# Patient Record
Sex: Male | Born: 1960 | Race: White | Hispanic: No | Marital: Married | Smoking: Never smoker
Health system: Southern US, Community
[De-identification: ages and names within clinical notes are randomized; demographics above are authoritative.]

## PROBLEM LIST (undated history)

## (undated) DIAGNOSIS — F411 Generalized anxiety disorder: Secondary | ICD-10-CM

## (undated) DIAGNOSIS — E739 Lactose intolerance, unspecified: Secondary | ICD-10-CM

## (undated) DIAGNOSIS — I2510 Atherosclerotic heart disease of native coronary artery without angina pectoris: Secondary | ICD-10-CM

## (undated) DIAGNOSIS — F3289 Other specified depressive episodes: Secondary | ICD-10-CM

## (undated) DIAGNOSIS — L259 Unspecified contact dermatitis, unspecified cause: Secondary | ICD-10-CM

## (undated) DIAGNOSIS — K219 Gastro-esophageal reflux disease without esophagitis: Secondary | ICD-10-CM

## (undated) DIAGNOSIS — E785 Hyperlipidemia, unspecified: Secondary | ICD-10-CM

## (undated) DIAGNOSIS — I2119 ST elevation (STEMI) myocardial infarction involving other coronary artery of inferior wall: Secondary | ICD-10-CM

## (undated) DIAGNOSIS — T7840XA Allergy, unspecified, initial encounter: Secondary | ICD-10-CM

## (undated) DIAGNOSIS — F329 Major depressive disorder, single episode, unspecified: Secondary | ICD-10-CM

## (undated) DIAGNOSIS — I495 Sick sinus syndrome: Secondary | ICD-10-CM

## (undated) DIAGNOSIS — L408 Other psoriasis: Secondary | ICD-10-CM

## (undated) HISTORY — PX: CORONARY STENT PLACEMENT: SHX1402

## (undated) HISTORY — DX: Unspecified contact dermatitis, unspecified cause: L25.9

## (undated) HISTORY — DX: Atherosclerotic heart disease of native coronary artery without angina pectoris: I25.10

## (undated) HISTORY — DX: Major depressive disorder, single episode, unspecified: F32.9

## (undated) HISTORY — DX: Lactose intolerance, unspecified: E73.9

## (undated) HISTORY — DX: ST elevation (STEMI) myocardial infarction involving other coronary artery of inferior wall: I21.19

## (undated) HISTORY — DX: Other psoriasis: L40.8

## (undated) HISTORY — DX: Hyperlipidemia, unspecified: E78.5

## (undated) HISTORY — DX: Sick sinus syndrome: I49.5

## (undated) HISTORY — DX: Other specified depressive episodes: F32.89

## (undated) HISTORY — DX: Allergy, unspecified, initial encounter: T78.40XA

## (undated) HISTORY — DX: Generalized anxiety disorder: F41.1

## (undated) HISTORY — DX: Gastro-esophageal reflux disease without esophagitis: K21.9

---

## 1997-04-21 ENCOUNTER — Encounter: Payer: Self-pay | Admitting: Family Medicine

## 1997-04-21 LAB — CONVERTED CEMR LAB: Blood Glucose, Fasting: 82 mg/dL

## 1999-05-29 ENCOUNTER — Encounter: Payer: Self-pay | Admitting: Family Medicine

## 1999-05-29 LAB — CONVERTED CEMR LAB: Blood Glucose, Fasting: 93 mg/dL

## 1999-08-12 ENCOUNTER — Encounter (INDEPENDENT_AMBULATORY_CARE_PROVIDER_SITE_OTHER): Payer: Self-pay | Admitting: *Deleted

## 1999-08-12 ENCOUNTER — Ambulatory Visit (HOSPITAL_BASED_OUTPATIENT_CLINIC_OR_DEPARTMENT_OTHER): Admission: RE | Admit: 1999-08-12 | Discharge: 1999-08-12 | Payer: Self-pay | Admitting: Surgery

## 2004-03-27 ENCOUNTER — Ambulatory Visit: Payer: Self-pay | Admitting: Family Medicine

## 2004-09-09 ENCOUNTER — Ambulatory Visit: Payer: Self-pay | Admitting: Family Medicine

## 2004-09-09 LAB — CONVERTED CEMR LAB
Blood Glucose, Fasting: 100 mg/dL
TSH: 0.79 microintl units/mL

## 2004-10-14 ENCOUNTER — Ambulatory Visit: Payer: Self-pay | Admitting: Family Medicine

## 2004-11-26 ENCOUNTER — Ambulatory Visit: Payer: Self-pay | Admitting: Family Medicine

## 2005-01-17 ENCOUNTER — Ambulatory Visit: Payer: Self-pay | Admitting: Family Medicine

## 2005-01-20 ENCOUNTER — Ambulatory Visit: Payer: Self-pay | Admitting: Family Medicine

## 2005-07-17 ENCOUNTER — Ambulatory Visit: Payer: Self-pay | Admitting: Family Medicine

## 2005-07-17 LAB — CONVERTED CEMR LAB: TSH: 1.02 microintl units/mL

## 2005-07-21 ENCOUNTER — Ambulatory Visit: Payer: Self-pay | Admitting: Family Medicine

## 2006-10-08 ENCOUNTER — Telehealth (INDEPENDENT_AMBULATORY_CARE_PROVIDER_SITE_OTHER): Payer: Self-pay | Admitting: *Deleted

## 2006-11-12 ENCOUNTER — Ambulatory Visit: Payer: Self-pay | Admitting: Family Medicine

## 2006-11-12 ENCOUNTER — Telehealth: Payer: Self-pay | Admitting: Family Medicine

## 2006-11-12 DIAGNOSIS — F329 Major depressive disorder, single episode, unspecified: Secondary | ICD-10-CM

## 2006-11-12 DIAGNOSIS — F411 Generalized anxiety disorder: Secondary | ICD-10-CM | POA: Insufficient documentation

## 2006-11-12 DIAGNOSIS — L259 Unspecified contact dermatitis, unspecified cause: Secondary | ICD-10-CM

## 2006-11-12 DIAGNOSIS — K219 Gastro-esophageal reflux disease without esophagitis: Secondary | ICD-10-CM

## 2006-11-12 DIAGNOSIS — E785 Hyperlipidemia, unspecified: Secondary | ICD-10-CM | POA: Insufficient documentation

## 2006-11-12 DIAGNOSIS — L408 Other psoriasis: Secondary | ICD-10-CM

## 2007-10-25 ENCOUNTER — Ambulatory Visit: Payer: Self-pay | Admitting: Family Medicine

## 2009-03-27 ENCOUNTER — Observation Stay (HOSPITAL_COMMUNITY): Admission: EM | Admit: 2009-03-27 | Discharge: 2009-03-28 | Payer: Self-pay | Admitting: Emergency Medicine

## 2009-03-27 ENCOUNTER — Encounter: Payer: Self-pay | Admitting: Emergency Medicine

## 2009-03-28 HISTORY — PX: AMPUTATION FINGER / THUMB: SUR24

## 2009-06-28 ENCOUNTER — Ambulatory Visit: Payer: Self-pay | Admitting: Family Medicine

## 2009-06-28 LAB — CONVERTED CEMR LAB
BUN: 17 mg/dL (ref 6–23)
Basophils Absolute: 0 10*3/uL (ref 0.0–0.1)
Bilirubin, Direct: 0.1 mg/dL (ref 0.0–0.3)
Cholesterol: 252 mg/dL — ABNORMAL HIGH (ref 0–200)
Creatinine, Ser: 0.9 mg/dL (ref 0.4–1.5)
GFR calc non Af Amer: 94.87 mL/min (ref >60)
Glucose, Bld: 107 mg/dL — ABNORMAL HIGH (ref 70–99)
HCT: 45.9 % (ref 39.0–52.0)
Lymphs Abs: 3.8 10*3/uL (ref 0.7–4.0)
Monocytes Absolute: 0.5 10*3/uL (ref 0.1–1.0)
Monocytes Relative: 6.5 % (ref 3.0–12.0)
Neutrophils Relative %: 42.8 % — ABNORMAL LOW (ref 43.0–77.0)
PSA: 0.79 ng/mL (ref 0.10–4.00)
Platelets: 237 10*3/uL (ref 150.0–400.0)
Potassium: 4.4 meq/L (ref 3.5–5.1)
RDW: 13.4 % (ref 11.5–14.6)
Total Bilirubin: 0.8 mg/dL (ref 0.3–1.2)
Total CHOL/HDL Ratio: 6
Triglycerides: 140 mg/dL (ref 0.0–149.0)
VLDL: 28 mg/dL (ref 0.0–40.0)

## 2009-07-03 ENCOUNTER — Ambulatory Visit: Payer: Self-pay | Admitting: Family Medicine

## 2009-07-25 ENCOUNTER — Encounter (INDEPENDENT_AMBULATORY_CARE_PROVIDER_SITE_OTHER): Payer: Self-pay | Admitting: *Deleted

## 2009-07-25 ENCOUNTER — Ambulatory Visit: Payer: Self-pay | Admitting: Family Medicine

## 2009-07-25 LAB — FECAL OCCULT BLOOD, GUAIAC: Fecal Occult Blood: NEGATIVE

## 2009-07-25 LAB — CONVERTED CEMR LAB: OCCULT 3: NEGATIVE

## 2009-08-01 HISTORY — PX: OTHER SURGICAL HISTORY: SHX169

## 2009-10-09 ENCOUNTER — Encounter (INDEPENDENT_AMBULATORY_CARE_PROVIDER_SITE_OTHER): Payer: Self-pay | Admitting: *Deleted

## 2010-01-31 DIAGNOSIS — I2510 Atherosclerotic heart disease of native coronary artery without angina pectoris: Secondary | ICD-10-CM

## 2010-01-31 DIAGNOSIS — E739 Lactose intolerance, unspecified: Secondary | ICD-10-CM

## 2010-01-31 HISTORY — DX: Atherosclerotic heart disease of native coronary artery without angina pectoris: I25.10

## 2010-01-31 HISTORY — DX: Lactose intolerance, unspecified: E73.9

## 2010-02-26 ENCOUNTER — Encounter: Payer: Self-pay | Admitting: Internal Medicine

## 2010-02-26 ENCOUNTER — Inpatient Hospital Stay (HOSPITAL_COMMUNITY)
Admission: EM | Admit: 2010-02-26 | Discharge: 2010-02-28 | Payer: Self-pay | Source: Home / Self Care | Attending: Cardiology | Admitting: Cardiology

## 2010-02-26 DIAGNOSIS — I2119 ST elevation (STEMI) myocardial infarction involving other coronary artery of inferior wall: Secondary | ICD-10-CM

## 2010-02-26 HISTORY — DX: ST elevation (STEMI) myocardial infarction involving other coronary artery of inferior wall: I21.19

## 2010-02-26 LAB — CONVERTED CEMR LAB: Hgb A1c MFr Bld: 6 %

## 2010-03-12 ENCOUNTER — Ambulatory Visit
Admission: RE | Admit: 2010-03-12 | Discharge: 2010-03-12 | Payer: Self-pay | Source: Home / Self Care | Attending: Physician Assistant | Admitting: Physician Assistant

## 2010-03-12 ENCOUNTER — Encounter: Payer: Self-pay | Admitting: Physician Assistant

## 2010-03-12 DIAGNOSIS — I2510 Atherosclerotic heart disease of native coronary artery without angina pectoris: Secondary | ICD-10-CM | POA: Insufficient documentation

## 2010-03-12 DIAGNOSIS — I2119 ST elevation (STEMI) myocardial infarction involving other coronary artery of inferior wall: Secondary | ICD-10-CM | POA: Insufficient documentation

## 2010-03-12 DIAGNOSIS — R7303 Prediabetes: Secondary | ICD-10-CM | POA: Insufficient documentation

## 2010-03-12 DIAGNOSIS — I495 Sick sinus syndrome: Secondary | ICD-10-CM | POA: Insufficient documentation

## 2010-03-15 ENCOUNTER — Telehealth: Payer: Self-pay | Admitting: Cardiology

## 2010-03-27 ENCOUNTER — Telehealth: Payer: Self-pay | Admitting: Cardiology

## 2010-04-04 NOTE — Progress Notes (Signed)
Summary: questions-  Phone Note Call from Patient Call back at [phone]   Caller: Patient Summary of Call: Pt feeling lightheaded with B/P 110/70 and heart rate mid high 40 and have question about when it would be okay for him to start exercising Initial call taken by: Judie Grieve,  March 15, 2010 10:57 AM  Follow-up for Phone Call        LVMTCB Whitney Maeola Sarah RN  March 15, 2010 11:13 AM  HR mid-high 40s. Patient feeling a little lightheaded. Wondering if we could decrease Metoprolol. BP 115/70. Advised him I would speak with MD but this should be okay. Whitney Maeola Sarah RN  March 15, 2010 12:36 PM  Follow-up by: Whitney Maeola Sarah RN,  March 15, 2010 11:13 AM  Additional Follow-up for Phone Call Additional follow up Details #1::        Change metoprolol to 25 mg 1/2 tab two times a day. Call if no better on Monday . . . on call provider over weekend if worse.  patient is aware to make med. change Ellender Hose RN  March 15, 2010 2:13 PM  Additional Follow-up by: Tereso Newcomer PA-C,  March 15, 2010 1:32 PM    New/Updated Medications: METOPROLOL TARTRATE 25 MG TABS (METOPROLOL TARTRATE) Take one half  tablet by mouth twice a day

## 2010-04-04 NOTE — Letter (Signed)
Summary: Nadara Eaton letter  [place] at [facility]  [address] [place], [place] [address]   Phone: [phone]  Fax: [phone]       10/09/2009 MRN: [id number]  Jahlani HAIL [address] [place], [place]  [address]  Dear Mr. [facility] [place] and [facility] announce the retirement of Arta Silence, M.D., from full-time practice at the [facility] office effective August 30, 2009 and his plans of returning part-time.  It is important to Dr. Hetty Ely and to our practice that you understand that [facility] has seven physicians in our office for your health care needs.  We will continue to offer the same exceptional care that you have today.    Dr. Hetty Ely has spoken to many of you about his plans for retirement and returning part-time in the fall.   We will continue to work with you through the transition to schedule appointments for you in the office and meet the high standards that [place] is committed to.   Again, it is with great pleasure that we share the news that Dr. Hetty Ely will return to [facility] in October of 2011 with a reduced schedule.    If you have any questions, or would like to request an appointment with one of our physicians, please call us at [phone] and press the option for Scheduling an appointment.  We take pleasure in providing you with excellent patient care and look forward to seeing you at your next office visit.  Our [facility] Physicians are:  Tillman Abide, M.D. Laurita Quint, M.D. Roxy Manns, M.D. Kerby Nora, M.D. Hannah Beat, M.D. Ruthe Mannan, M.D. We proudly welcomed Raechel Ache, M.D. and Eustaquio Boyden, M.D. to the practice in July/August 2011.  Sincerely,  [facility]

## 2010-04-04 NOTE — Letter (Signed)
Summary: Results Follow up Letter  [place] at [facility]  [address] [place], [place] [address]   Phone: [phone]  Fax: [phone]    07/25/2009 MRN: [id number]  Moxon HOLSTON [address] [place], [place]  [address]  Dear Mr. OUZTS,  The following are the results of your recent test(s):  Test         Result    Pap Smear:        Normal _____  Not Normal _____ Comments: ______________________________________________________ Cholesterol: LDL(Bad cholesterol):         Your goal is less than:         HDL (Good cholesterol):       Your goal is more than: Comments:  ______________________________________________________ Mammogram:        Normal _____  Not Normal _____ Comments:  ___________________________________________________________________ Hemoccult:        Normal __X___  Not normal _______ Comments:  Please repeat in one year.  _____________________________________________________________________ Other Tests:    We routinely do not discuss normal results over the telephone.  If you desire a copy of the results, or you have any questions about this information we can discuss them at your next office visit.   Sincerely,    Laurita Quint, MD
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
## 2010-04-04 NOTE — Assessment & Plan Note (Addendum)
Summary: Post Hosp s/p MI   Primary Provider:  Shaune Leeks MD  CC:  Patient is still a little lethargic.Marland Kitchen  History of Present Illness: Primary Cardiologist:  Dr. Shawnie Pons  Jonathan Mcintosh is a 50 yo male with HLP and GERD who was recently admitted to [facility] on 02/26/2010 with CP.  He ruled in for a NSTEMI.  He had recurrent pain and EKG demonstrated Inf STEMI.  He was taken emergently to the cath lab.  He had high grade CAD in the RCA treated with a DES x 2.  He also had moderate residual disease in the LAD.  EF is preserved at 55%.  He returns for follow up.  Labs during his hospitalization 01/2010:  K 4.1; Creat 0.92; AT 24; ALT 43; A1C 6.0; TSH 0.921; Hgb 15.7  He is doing well.  He denies chest discomfort or shortness of breath.  He denies orthopnea, PND or pedal edema.  He denies syncope.  He has had 2 episodes of lightheadedness.  This was with prolonged standing and being in a large crowd.  Otherwise, he denies any fatigue, lightheadedness or near syncope.  Current Medications (verified): 1)  Prilosec Otc 20 Mg Tbec (Omeprazole Magnesium) .... Take One By Mouth Daily 2)  Aspirin Ec 325 Mg Tbec (Aspirin) .... Take One Tablet By Mouth Daily 3)  Metoprolol Tartrate 25 Mg Tabs (Metoprolol Tartrate) .... Take One Tablet By Mouth Twice A Day 4)  Nitrostat 0.4 Mg Subl (Nitroglycerin) .Marland Kitchen.. 1 Tablet Under Tongue At Onset of Chest Pain; You May Repeat Every 5 Minutes For Up To 3 Doses. 5)  Effient 10 Mg Tabs (Prasugrel Hcl) .... Take One Tablet By Mouth Daily 6)  Crestor 40 Mg Tabs (Rosuvastatin Calcium) .... Take One Tablet By Mouth Daily. 7)  Vitamin D3  Otc .... Take 1 Tablet By Mouth Once A Day  Allergies (verified): No Known Drug Allergies  Past History:  Past Medical History: CAD   a. Acute Inf MI 02/26/10 (initially NSTEMI . . recurrent pain . Marland Kitchen Marland KitchenSTEMI)   b. s/p DES x 2 RCA 02/28/10   c. 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  All other systems reviewed and negative.   Vital Signs:  Patient profile:   50 year old male Height:      71.25 inches Weight:      200.50 pounds BMI:     27.87 Pulse rate:   54 / minute Resp:     14 per minute BP sitting:   122 / 86  (left arm)  Vitals Entered By: Micki Riley CNA (March 12, 2010 9:09 AM) CC: Patient is still a little lethargic.   Physical Exam  General:  Well nourished, well developed, in no acute distress HEENT: normal Neck: no JVD Cardiac:  normal S1, S2; RRR; no murmur Lungs:  clear to auscultation bilaterally, no wheezing, rhonchi or rales Abd: soft, nontender, no hepatomegaly Ext: no edema; RFA site without hematoma or bruit Vascular: no carotid  bruits Skin: warm and dry Neuro:  CNs 2-12 intact, no focal abnormalities noted    EKG  Procedure date:  03/12/2010  Findings:      sinus  bradycardia Heart rate 54 Normal axis Borderline inferior Q waves T-wave inversions in leads 3 and aVF no significant change since 02/28/10  Impression & Recommendations:  Problem # 1:  ACUT MYOCARD INFARCT OTH INF WALL EPIS CARE UNS (ICD-410.40) He is doing well post myocardial infarction.  He will continue on aspirin and Effient.  He seems to be tolerating his medications well.  Of note, he does have bradycardia.  Overall he does not seem to be symptomatic with this.  However, he has had a couple of episodes of lightheadedness.  I have asked him to contact us should he continue to have these symptoms.  At that point, I would decrease his metoprolol to 12.5 mg twice daily.  He is waiting for a return phone call for cardiac rehab.  He has been walking  daily without chest pain or dyspnea.  He can return to work 1/2 time and gradually increase as tolerated.  He was provided a note today.  Problem # 2:  CORONARY ATHEROSCLEROSIS NATIVE CORONARY ARTERY (ICD-414.01) He had moderate residual disease in the LAD.  There is a mention of a possibly following up with a nuclear study to assess the LAD territory.  I will be in touch with Dr. [name] to see if this needs to be done prior to his follow up.      Problem # 3:  GLUCOSE INTOLERANCE (ICD-271.3) Recent A1C was 6.  He should follow up with his PCP.  Problem # 4:  HYPERLIPIDEMIA (ICD-272.4) Schedule FLP and LFTs in 8 weeks. Goal LDL is < 70.  His updated medication list for this problem includes:    Crestor 40 Mg Tabs (Rosuvastatin calcium) .Marland Kitchen... Take one tablet by mouth daily.  Problem # 5:  SINUS BRADYCARDIA (ICD-427.81) Overall, I think he is tolerating this.  If he has fatigue or lightheadedness in the future, he should call us and we will cut his metoprolol in 1/2.  Other Orders: EKG w/ Interpretation (93000)  Patient Instructions: 1)  Your physician recommends that you schedule a follow-up appointment in: 6 weeks with Dr. [name] 2)  Your physician recommends that you return for a FASTING lipid profile and liver function test in 6-8 weeks.  3)  Your physician recommends that you continue on your current medications as directed. Please refer to the Current Medication list given to you today. 4)  Please call us if you continue to feel fatigued.   Appended Document: Post Hosp s/p MI    Clinical Lists Changes  Medications: Rx of METOPROLOL TARTRATE 25 MG TABS (METOPROLOL TARTRATE) Take one tablet by mouth twice a day;  #60 x 6;  Signed;  Entered by: Whitney Maeola Sarah RN;  Authorized by: Ronaldo Miyamoto, MD, [facility];  Method used: Electronically to [facility]. [address], [place], [place]  [address], Ph: [phone], Fax: [phone]    Prescriptions: METOPROLOL  TARTRATE 25 MG TABS (METOPROLOL TARTRATE) Take one tablet by mouth twice a day  #60 x 6   Entered by:   Whitney Maeola Sarah RN   Authorized by:   Ronaldo Miyamoto, MD, [facility]   Signed by:   Ellender Hose RN on 03/12/2010   Method used:   Electronically to        [facility]. [address] (retail)       [address]       [place], [place]  [address]       Ph: [phone]  Fax: [phone]   RxID:   [id number]

## 2010-04-04 NOTE — Letter (Signed)
Summary: Work Writer, Main Office  [address]   [place], [place] [address]   Phone: [phone]  Fax: [phone]    Today's Date: March 12, 2010  Name of Patient: Jonathan Mcintosh  The above named patient had a medical visit today at:  9 am.  Please take this into consideration when reviewing the time away from work.    Special Instructions:  [  ] None  [  ] To be off the remainder of today, returning to the normal work / school schedule tomorrow.  [  ] To be off until the next scheduled appointment on ______________________.  [ x ] Other [name] may return to working 1/2 days this week.  He can gradually increase his hours to full time over the next 2-3 weeks.   Sincerely yours,   Tereso Newcomer PA-C

## 2010-04-08 ENCOUNTER — Ambulatory Visit (HOSPITAL_COMMUNITY): Payer: 59

## 2010-04-10 ENCOUNTER — Encounter: Payer: Self-pay | Admitting: Cardiology

## 2010-04-10 ENCOUNTER — Ambulatory Visit (HOSPITAL_COMMUNITY): Payer: Self-pay

## 2010-04-12 ENCOUNTER — Ambulatory Visit (HOSPITAL_COMMUNITY): Payer: Self-pay

## 2010-04-15 ENCOUNTER — Ambulatory Visit (HOSPITAL_COMMUNITY): Payer: Self-pay

## 2010-04-17 ENCOUNTER — Ambulatory Visit (HOSPITAL_COMMUNITY): Payer: Self-pay

## 2010-04-17 ENCOUNTER — Telehealth (INDEPENDENT_AMBULATORY_CARE_PROVIDER_SITE_OTHER): Payer: Self-pay | Admitting: Radiology

## 2010-04-18 ENCOUNTER — Encounter: Payer: Self-pay | Admitting: Cardiology

## 2010-04-18 ENCOUNTER — Encounter: Payer: Self-pay | Admitting: Family Medicine

## 2010-04-18 ENCOUNTER — Ambulatory Visit (HOSPITAL_COMMUNITY): Payer: 59 | Attending: Cardiology

## 2010-04-18 ENCOUNTER — Other Ambulatory Visit (INDEPENDENT_AMBULATORY_CARE_PROVIDER_SITE_OTHER): Payer: 59

## 2010-04-18 ENCOUNTER — Other Ambulatory Visit: Payer: Self-pay

## 2010-04-18 DIAGNOSIS — R079 Chest pain, unspecified: Secondary | ICD-10-CM | POA: Insufficient documentation

## 2010-04-18 DIAGNOSIS — E785 Hyperlipidemia, unspecified: Secondary | ICD-10-CM

## 2010-04-18 DIAGNOSIS — I2510 Atherosclerotic heart disease of native coronary artery without angina pectoris: Secondary | ICD-10-CM

## 2010-04-18 DIAGNOSIS — R0789 Other chest pain: Secondary | ICD-10-CM

## 2010-04-18 LAB — HEPATIC FUNCTION PANEL
Alkaline Phosphatase: 68 U/L (ref 39–117)
Bilirubin, Direct: 0.1 mg/dL (ref 0.0–0.3)
Total Bilirubin: 0.6 mg/dL (ref 0.3–1.2)

## 2010-04-18 LAB — LIPID PANEL
HDL: 36.4 mg/dL — ABNORMAL LOW (ref >39.00)
LDL Cholesterol: 76 mg/dL (ref 0–99)
Total CHOL/HDL Ratio: 4
VLDL: 16.4 mg/dL (ref 0.0–40.0)

## 2010-04-19 ENCOUNTER — Ambulatory Visit (HOSPITAL_COMMUNITY): Payer: Self-pay

## 2010-04-19 ENCOUNTER — Other Ambulatory Visit: Payer: Self-pay

## 2010-04-22 ENCOUNTER — Ambulatory Visit (HOSPITAL_COMMUNITY): Payer: Self-pay

## 2010-04-24 ENCOUNTER — Encounter: Payer: Self-pay | Admitting: Cardiology

## 2010-04-24 ENCOUNTER — Ambulatory Visit: Payer: Self-pay | Admitting: Physician Assistant

## 2010-04-24 ENCOUNTER — Ambulatory Visit (HOSPITAL_COMMUNITY): Payer: Self-pay

## 2010-04-24 ENCOUNTER — Ambulatory Visit (INDEPENDENT_AMBULATORY_CARE_PROVIDER_SITE_OTHER): Payer: 59 | Admitting: Cardiology

## 2010-04-24 DIAGNOSIS — I2510 Atherosclerotic heart disease of native coronary artery without angina pectoris: Secondary | ICD-10-CM

## 2010-04-24 DIAGNOSIS — E785 Hyperlipidemia, unspecified: Secondary | ICD-10-CM

## 2010-04-24 DIAGNOSIS — I2119 ST elevation (STEMI) myocardial infarction involving other coronary artery of inferior wall: Secondary | ICD-10-CM

## 2010-04-24 NOTE — Progress Notes (Signed)
Summary: Question need for myoview  Phone Note Outgoing Call   Call placed by: Julieta Gutting, RN, BSN,  March 27, 2010 4:09 PM Call placed to: Patient Summary of Call: Per Dr [name] this pt needs an Exercise Stress Myoview per cath note. Per the computer it looks like the pt is scheduled for a Lexiscan myoview on 2/17 at 8:30 but this is actually an error.  The pt is not scheduled for a myoview at this time and is not aware that he may need this test done.  I will speak with Dr [name] and further clarify if myoview needs to be done at this time.  Pt has a pending appt with Scott PA-C on 04/24/10. Initial call taken by: Julieta Gutting, RN, BSN,  March 27, 2010 4:13 PM  Follow-up for Phone Call        He should have a stress myoview to assess his LAD, with followup with me to review because of other disease.   Follow-up by: Ronaldo Miyamoto, MD, [facility],  March 29, 2010 7:14 AM  Additional Follow-up for Phone Call Additional follow up Details #1::        No answer at home number. Julieta Gutting, RN, BSN  April 09, 2010 2:17 PM   [phone]---V was able to reach the pt on his cell phone and informed him that Dr [name] would like him to have a stress myoview prior to appt.  I reviewed instructions with the pt over the phone and will also mail instruction sheet to the pt.  Order placed for test.  Pt scheduled for Myoview on 04/18/10. OV with Scott PA-C on 04/24/10.      Additional Follow-up by: Julieta Gutting, RN, BSN,  April 09, 2010 2:24 PM    Additional Follow-up for Phone Call Additional follow up Details #2::    Pt appt changed to 04/24/10 at 3:15 with Dr [name].  Pt aware during myoview. Julieta Gutting, RN, BSN  April 18, 2010 8:59 AM

## 2010-04-24 NOTE — Assessment & Plan Note (Addendum)
Summary: Cardiology Nuclear Testing  Nuclear Med Background Indications for Stress Test: Evaluation for Ischemia, Stent Patency   History: Heart Catheterization, Myocardial Infarction, Stents  History Comments: 02/26/10 MI initial NSTEMI>STEMI (IWMI); 02/28/10 Cath/stent RCAx2  Symptoms: Fatigue, Light-Headedness, Palpitations  Symptoms Comments: Bradycardia   Nuclear Pre-Procedure Cardiac Risk Factors: Lipids Caffeine/Decaff Intake: None NPO After: 9:00 PM Lungs: clear IV 0.9% NS with Angio Cath: 20g     IV Site: R Antecubital IV Started by: Irean Hong, RN Chest Size (in) 46     Height (in): 71.25 Weight (lb): 193 BMI: 26.83 Tech Comments: Held metoprolol 24 hrs.  Nuclear Med Study 1 or 2 day study:  1 day     Stress Test Type:  Stress Reading MD:  Willa Rough, MD     Referring MD:  T.Stuckey Resting Radionuclide:  Technetium 61m Tetrofosmin     Resting Radionuclide Dose:  11.0 mCi  Stress Radionuclide:  Technetium 58m Tetrofosmin     Stress Radionuclide Dose:  33.0 mCi   Stress Protocol Exercise Time (min):  10:01 min     Max HR:  160 bpm     Predicted Max HR:  170 bpm  Max Systolic BP: 162 mm Hg     Percent Max HR:  94.12 %     METS: 11.7 Rate Pressure Product:  19147    Stress Test Technologist:  Milana Na, EMT-P     Nuclear Technologist:  Doyne Keel, CNMT  Rest Procedure  Myocardial perfusion imaging was performed at rest 45 minutes following the intravenous administration of Technetium 55m Tetrofosmin.  Stress Procedure  The patient exercised for 10:01. The patient stopped due to fatigue and denied any chest pain.  There were no significant ST-T wave changes and occ pvcs.  Technetium 53m Tetrofosmin was injected at peak exercise and myocardial perfusion imaging was performed after a brief delay.  QPS Raw Data Images:  Patient motion noted; appropriate software correction applied. Stress Images:  No diagnostic abnormalities Rest Images:  Same as  stress. Subtraction (SDS):  No evidence of ischemia. Transient Ischemic Dilatation:  1.02  (Normal <1.22)  Lung/Heart Ratio:  0.30  (Normal <0.45)  Quantitative Gated Spect Images QGS EDV:  104 ml QGS ESV:  37 ml QGS EF:  65 % QGS cine images:  Normal  Findings Low risk nuclear study      Overall Impression  Exercise Capacity: Good exercise capacity. BP Response: Normal blood pressure response. Clinical Symptoms: No chest pain ECG Impression: No significant ST segment change suggestive of ischemia. Overall Impression Comments: There is apical thinning. There is interference from visceral activity that affects the images. However, there is no definite scar or ischemia.  Appended Document: Cardiology Nuclear Testing Attempted to call listed number.  No answer times two.  Will try again tomorow.  Films and nuc study reviewed in detail.  TS

## 2010-04-24 NOTE — Progress Notes (Signed)
Summary: Nuclear Pre-Procedure  Phone Note Outgoing Call Call back at [phone]   Call placed by: Stanton Kidney, EMT-P,  April 17, 2010 11:24 AM Call placed to: Patient Action Taken: Phone Call Completed Reason for Call: Confirm/change Appt Summary of Call: Left message with information on [employer] Information Sheet (see scanned document for details). Stanton Kidney, EMT-P  April 17, 2010 11:24 AM      Nuclear Med Background Indications for Stress Test: Evaluation for Ischemia, Stent Patency   History: Heart Catheterization, Myocardial Infarction, Stents  History Comments: 02/26/10 MI initial NSTEMI>STEMI (IWMI); 02/28/10 Cath/stent RCAx2  Symptoms: Fatigue, Light-Headedness  Symptoms Comments: Bradycardia   Nuclear Pre-Procedure Cardiac Risk Factors: Lipids Height (in): 71.25

## 2010-04-26 ENCOUNTER — Ambulatory Visit (HOSPITAL_COMMUNITY): Payer: Self-pay

## 2010-04-29 ENCOUNTER — Ambulatory Visit (HOSPITAL_COMMUNITY): Payer: Self-pay

## 2010-05-01 ENCOUNTER — Ambulatory Visit (HOSPITAL_COMMUNITY): Payer: Self-pay

## 2010-05-02 ENCOUNTER — Ambulatory Visit (INDEPENDENT_AMBULATORY_CARE_PROVIDER_SITE_OTHER): Payer: 59 | Admitting: Internal Medicine

## 2010-05-02 ENCOUNTER — Encounter: Payer: Self-pay | Admitting: Internal Medicine

## 2010-05-02 DIAGNOSIS — E785 Hyperlipidemia, unspecified: Secondary | ICD-10-CM

## 2010-05-02 DIAGNOSIS — E739 Lactose intolerance, unspecified: Secondary | ICD-10-CM

## 2010-05-02 DIAGNOSIS — I2510 Atherosclerotic heart disease of native coronary artery without angina pectoris: Secondary | ICD-10-CM

## 2010-05-03 ENCOUNTER — Ambulatory Visit (HOSPITAL_COMMUNITY): Payer: Self-pay

## 2010-05-06 ENCOUNTER — Ambulatory Visit (HOSPITAL_COMMUNITY): Payer: Self-pay

## 2010-05-08 ENCOUNTER — Ambulatory Visit (HOSPITAL_COMMUNITY): Payer: Self-pay

## 2010-05-09 ENCOUNTER — Other Ambulatory Visit: Payer: 59
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
## 2010-05-10 ENCOUNTER — Ambulatory Visit (HOSPITAL_COMMUNITY): Payer: Self-pay

## 2010-05-13 ENCOUNTER — Ambulatory Visit (HOSPITAL_COMMUNITY): Payer: Self-pay

## 2010-05-13 LAB — CBC
HCT: 45.2 % (ref 39.0–52.0)
Hemoglobin: 15.7 g/dL (ref 13.0–17.0)
MCH: 27.4 pg (ref 26.0–34.0)
MCHC: 34.7 g/dL (ref 30.0–36.0)
MCV: 78.5 fL (ref 78.0–100.0)
MCV: 78.9 fL (ref 78.0–100.0)
Platelets: 208 10*3/uL (ref 150–400)
RDW: 13.1 % (ref 11.5–15.5)
RDW: 13.2 % (ref 11.5–15.5)
WBC: 9 10*3/uL (ref 4.0–10.5)

## 2010-05-13 LAB — LIPID PANEL
Cholesterol: 211 mg/dL — ABNORMAL HIGH (ref 0–200)
LDL Cholesterol: 156 mg/dL — ABNORMAL HIGH (ref 0–99)
Total CHOL/HDL Ratio: 5.7 RATIO
Triglycerides: 92 mg/dL (ref <150)
VLDL: 18 mg/dL (ref 0–40)

## 2010-05-13 LAB — BASIC METABOLIC PANEL
BUN: 23 mg/dL (ref 6–23)
Calcium: 9 mg/dL (ref 8.4–10.5)
Chloride: 107 mEq/L (ref 96–112)
Creatinine, Ser: 0.94 mg/dL (ref 0.4–1.5)
GFR calc non Af Amer: >60 mL/min (ref >60)

## 2010-05-13 LAB — CARDIAC PANEL(CRET KIN+CKTOT+MB+TROPI)
CK, MB: 2.3 ng/mL (ref 0.3–4.0)
CK, MB: 2.5 ng/mL (ref 0.3–4.0)
Total CK: 68 U/L (ref 7–232)
Troponin I: 0.46 ng/mL — ABNORMAL HIGH (ref 0.00–0.06)

## 2010-05-13 LAB — COMPREHENSIVE METABOLIC PANEL
BUN: 15 mg/dL (ref 6–23)
CO2: 23 mEq/L (ref 19–32)
Calcium: 9 mg/dL (ref 8.4–10.5)
Creatinine, Ser: 0.92 mg/dL (ref 0.4–1.5)
GFR calc non Af Amer: >60 mL/min (ref >60)
Glucose, Bld: 106 mg/dL — ABNORMAL HIGH (ref 70–99)
Total Protein: 6.7 g/dL (ref 6.0–8.3)

## 2010-05-13 LAB — CK TOTAL AND CKMB (NOT AT ARMC)
CK, MB: 2.4 ng/mL (ref 0.3–4.0)
Relative Index: INVALID (ref 0.0–2.5)

## 2010-05-13 LAB — TROPONIN I: Troponin I: 0.21 ng/mL — ABNORMAL HIGH (ref 0.00–0.06)

## 2010-05-13 LAB — DIFFERENTIAL
Basophils Absolute: 0 10*3/uL (ref 0.0–0.1)
Basophils Relative: 0 % (ref 0–1)
Eosinophils Absolute: 0.1 10*3/uL (ref 0.0–0.7)
Eosinophils Relative: 2 % (ref 0–5)
Lymphocytes Relative: 45 % (ref 12–46)
Lymphs Abs: 3.5 10*3/uL (ref 0.7–4.0)
Monocytes Relative: 8 % (ref 3–12)
Neutro Abs: 5.6 10*3/uL (ref 1.7–7.7)
Neutrophils Relative %: 56 % (ref 43–77)

## 2010-05-13 LAB — HEMOGLOBIN A1C: Mean Plasma Glucose: 126 mg/dL — ABNORMAL HIGH (ref <117)

## 2010-05-13 LAB — POCT CARDIAC MARKERS
Troponin i, poc: 0.1 ng/mL — ABNORMAL HIGH (ref 0.00–0.09)
Troponin i, poc: <0.05 ng/mL (ref 0.00–0.09)

## 2010-05-14 NOTE — Assessment & Plan Note (Signed)
Summary: 6 wk    Visit Type:  Follow-up Primary Provider:  Shaune Leeks MD  CC:  pt has no complaints today.  History of Present Illness: Jonathan Mcintosh is in for a follow up visit.  He came in and is doing great.  He has residual CAD, but underwent radionuclide imaging to assess for residual ischemia, and this revealed more than ten minutes of exercise without ST change, normal EF, and no residual perfusion deficits.  He is now pain free, and doing quite well post Mi.  We reveiwed all aspects of his care, and I did suggest that he not run due to residual disease.  He is doing well overall.    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 Take One By Mouth Daily 2)  Aspirin Ec 325 Mg Tbec (Aspirin) .... Take One Tablet By Mouth Daily 3)  Metoprolol Tartrate 25 Mg Tabs (Metoprolol Tartrate) .... Take One Half  Tablet By Mouth Twice A Day 4)  Nitrostat 0.4 Mg Subl (Nitroglycerin) .Marland Kitchen.. 1 Tablet Under Tongue At Onset of Chest Pain; You May Repeat Every 5 Minutes For Up To 3 Doses. 5)  Effient 10 Mg Tabs (Prasugrel Hcl) .... Take One Tablet By Mouth Daily 6)  Crestor 40 Mg Tabs (Rosuvastatin Calcium) .... Take One Tablet By Mouth Daily. 7)  Vitamin D3  Otc .... Take 1 Tablet By Mouth Once A Day 8)  Fish Oil 1000 Mg Caps (Omega-3 Fatty Acids) .... Take 1 Tablet By Mouth Once A Day  Allergies  (verified): No Known Drug Allergies  Vital Signs:  Patient profile:   50 year old male Height:      71.25 inches Weight:      197 pounds BMI:     27.38 Pulse rate:   64 / minute Resp:     17 per minute BP sitting:   128 / 82  (left arm) Cuff size:   regular  Vitals Entered By: Celestia Khat, CMA (April 24, 2010 3:31 PM)  Physical Exam  General:  Well developed, well nourished, in no acute distress. Head:  normocephalic and atraumatic Eyes:  PERRLA/EOM intact; conjunctiva and lids normal. Lungs:  Clear bilaterally to auscultation and percussion. Heart:  PMI non displaced.  No rub, murmur, or gallop.   Pulses:  pulses normal in all 4 extremities Extremities:  No clubbing or cyanosis. Neurologic:  Alert and oriented x 3.   Nuclear ETT  Procedure date:  04/18/2010  Findings:      Findings  Low risk nuclear study      Overall Impression   Exercise Capacity: Good exercise capacity. BP Response: Normal blood pressure response. Clinical Symptoms: No chest pain ECG Impression: No significant ST segment change suggestive of ischemia. Overall Impression Comments: There is apical thinning. There is interference from visceral activity that affects the images. However, there is no definite scar or ischemia.    Signed  by Talitha Givens, MD, [facility] on 04/18/2010 at 5:55 PM   Cardiac Cath  Procedure date:  02/28/2010  Findings:      ANGIOGRAPHIC DATA: 1. The left main is free of critical disease. 2. The LAD appears to have some diffuse plaque throughout.  The most     severe area is about 60-70% after the takeoff of the diagonal.  The     diagonal itself has about 40-50% narrowing.  Both vessels appear     diffusely diseased. 3. The circumflex provides predominantly a fairly large marginal that     is without critical narrowing.  There is mild diffuse plaque. 4. The right coronary artery is a large-caliber vessel.  There is     diffuse 60-70% narrowing  throughout the midportion over a fairly     long area.  Distally, there is a subtotal occlusion with 99%     narrowing.  Both areas were reduced to 0% residual luminal     narrowing.  At the junction of the proximal and midvessel at the     top, there was about an area of 40% narrowing.  The PDA and PLA     were without critical narrowing. 5. Ventriculography in the RAO projection reveals vigorous global     systolic function.  There is not a definite wall motion abnormality     and the EF is in excess of 55%. 6. Intravascular ultrasound was performed.  This demonstrated an     approximate 3.5-mm vessel throughout the midportion.  There was     diffuse segmental plaque in the midportion with the most severe     area and reduction about 4.47 sq mm.  The plaque was really quite     segmental.  CONCLUSION: 1. Acute inferior wall infarction with successful percutaneous     stenting of the distal and mid right coronary stenosis. 2. Moderate residual disease involving the left anterior descending     and diagonal branches. 3. Preserved overall left ventricular function.  DISPOSITION:  The patient will be treated medically.  He will have aggressive risk factor reduction.  We will do exercise testing to assess the left anterior descending artery.     [name]. [name], MD, [facility]  Impression & Recommendations:  Problem # 1:  CORONARY ATHEROSCLEROSIS NATIVE CORONARY ARTERY (ICD-414.01) Findings as noted. Exercise discussed.  Patient is doing well.  No residual ischemia at present.  Continue medical therapy at present.  His updated medication list for this problem includes:    Aspirin Ec 325 Mg Tbec (Aspirin) .Marland Kitchen... Take one tablet by mouth daily    Metoprolol Tartrate 25 Mg Tabs (Metoprolol tartrate) .Marland Kitchen... Take one half  tablet by mouth twice a day    Nitrostat 0.4 Mg Subl (Nitroglycerin) .Marland Kitchen... 1 tablet under tongue at onset of chest pain; you may repeat every 5 minutes for up to 3  doses.    Effient 10 Mg Tabs (Prasugrel hcl) .Marland Kitchen... Take one tablet by mouth daily  Problem # 2:  HYPERLIPIDEMIA (ICD-272.4) Will need followup lipid and liver profile.  Done today.  LDL slightly above target.  Monitor.    His updated medication list for this problem includes:    Crestor 40 Mg Tabs (Rosuvastatin calcium) .Marland Kitchen... Take one tablet by mouth daily.  Patient Instructions: 1)  Your physician recommends that you schedule a follow-up appointment in: 3 MONTHS 2)  Your physician recommends that you continue on your current medications as directed. Please refer  to the Current Medication list given to you today.

## 2010-05-15 ENCOUNTER — Ambulatory Visit (HOSPITAL_COMMUNITY): Payer: Self-pay

## 2010-05-17 ENCOUNTER — Ambulatory Visit (HOSPITAL_COMMUNITY): Payer: Self-pay

## 2010-05-20 ENCOUNTER — Ambulatory Visit (HOSPITAL_COMMUNITY): Payer: Self-pay

## 2010-05-21 NOTE — Miscellaneous (Signed)
Summary: [place] Physician Order/Treatment Plan   [facility] Physician Order/Treatment Plan   Imported By: Roderic Ovens 05/10/2010 10:27:53  _____________________________________________________________________  External Attachment:    Type:   Image     Comment:   External Document

## 2010-05-22 ENCOUNTER — Ambulatory Visit (HOSPITAL_COMMUNITY): Payer: Self-pay

## 2010-05-24 ENCOUNTER — Ambulatory Visit (HOSPITAL_COMMUNITY): Payer: Self-pay

## 2010-05-27 ENCOUNTER — Ambulatory Visit (HOSPITAL_COMMUNITY): Payer: Self-pay

## 2010-05-29 ENCOUNTER — Ambulatory Visit (HOSPITAL_COMMUNITY): Payer: Self-pay

## 2010-05-31 ENCOUNTER — Ambulatory Visit (HOSPITAL_COMMUNITY): Payer: Self-pay

## 2010-06-03 ENCOUNTER — Ambulatory Visit (HOSPITAL_COMMUNITY): Payer: Self-pay

## 2010-06-05 ENCOUNTER — Ambulatory Visit (HOSPITAL_COMMUNITY): Payer: Self-pay

## 2010-06-07 ENCOUNTER — Ambulatory Visit (HOSPITAL_COMMUNITY): Payer: Self-pay

## 2010-06-10 ENCOUNTER — Ambulatory Visit (HOSPITAL_COMMUNITY): Payer: Self-pay

## 2010-06-10 ENCOUNTER — Other Ambulatory Visit: Payer: Self-pay | Admitting: *Deleted

## 2010-06-10 MED ORDER — METOPROLOL TARTRATE 25 MG PO TABS: 12.5000 mg | ORAL_TABLET | Freq: Two times a day (BID) | ORAL | Status: DC

## 2010-06-12 ENCOUNTER — Ambulatory Visit (HOSPITAL_COMMUNITY): Payer: Self-pay

## 2010-06-14 ENCOUNTER — Ambulatory Visit (HOSPITAL_COMMUNITY): Payer: Self-pay

## 2010-06-17 ENCOUNTER — Ambulatory Visit (HOSPITAL_COMMUNITY): Payer: Self-pay

## 2010-06-19 ENCOUNTER — Ambulatory Visit (HOSPITAL_COMMUNITY): Payer: Self-pay

## 2010-06-21 ENCOUNTER — Ambulatory Visit (HOSPITAL_COMMUNITY): Payer: Self-pay

## 2010-06-24 ENCOUNTER — Ambulatory Visit (HOSPITAL_COMMUNITY): Payer: Self-pay

## 2010-06-26 ENCOUNTER — Ambulatory Visit (HOSPITAL_COMMUNITY): Payer: Self-pay

## 2010-06-28 ENCOUNTER — Ambulatory Visit (HOSPITAL_COMMUNITY): Payer: Self-pay

## 2010-07-01 ENCOUNTER — Ambulatory Visit (HOSPITAL_COMMUNITY): Payer: Self-pay

## 2010-07-03 ENCOUNTER — Ambulatory Visit (HOSPITAL_COMMUNITY): Payer: Self-pay

## 2010-07-05 ENCOUNTER — Ambulatory Visit (HOSPITAL_COMMUNITY): Payer: Self-pay

## 2010-07-08 ENCOUNTER — Ambulatory Visit (HOSPITAL_COMMUNITY): Payer: Self-pay

## 2010-07-09 ENCOUNTER — Other Ambulatory Visit (INDEPENDENT_AMBULATORY_CARE_PROVIDER_SITE_OTHER): Payer: 59

## 2010-07-09 ENCOUNTER — Other Ambulatory Visit: Payer: 59

## 2010-07-09 DIAGNOSIS — Z0389 Encounter for observation for other suspected diseases and conditions ruled out: Secondary | ICD-10-CM

## 2010-07-09 DIAGNOSIS — Z0000 Encounter for general adult medical examination without abnormal findings: Secondary | ICD-10-CM

## 2010-07-09 LAB — CBC WITH DIFFERENTIAL/PLATELET
Basophils Relative: 0.6 % (ref 0.0–3.0)
Eosinophils Relative: 1.9 % (ref 0.0–5.0)
HCT: 46.8 % (ref 39.0–52.0)
Hemoglobin: 15.8 g/dL (ref 13.0–17.0)
Lymphs Abs: 3.7 10*3/uL (ref 0.7–4.0)
MCHC: 33.9 g/dL (ref 30.0–36.0)
MCV: 82.7 fl (ref 78.0–100.0)
Monocytes Absolute: 0.5 10*3/uL (ref 0.1–1.0)
Neutro Abs: 4.1 10*3/uL (ref 1.4–7.7)
Neutrophils Relative %: 48.2 % (ref 43.0–77.0)
RBC: 5.65 Mil/uL (ref 4.22–5.81)
WBC: 8.5 10*3/uL (ref 4.5–10.5)

## 2010-07-09 LAB — HEPATIC FUNCTION PANEL
ALT: 32 U/L (ref 0–53)
AST: 23 U/L (ref 0–37)
Bilirubin, Direct: 0.1 mg/dL (ref 0.0–0.3)
Total Bilirubin: 0.8 mg/dL (ref 0.3–1.2)
Total Protein: 6.8 g/dL (ref 6.0–8.3)

## 2010-07-09 LAB — URINALYSIS
Ketones, ur: NEGATIVE
Specific Gravity, Urine: 1.02 (ref 1.000–1.030)
Total Protein, Urine: NEGATIVE
Urine Glucose: NEGATIVE
Urobilinogen, UA: 0.2 (ref 0.0–1.0)

## 2010-07-09 LAB — BASIC METABOLIC PANEL
CO2: 29 mEq/L (ref 19–32)
Chloride: 107 mEq/L (ref 96–112)
Potassium: 4.4 mEq/L (ref 3.5–5.1)
Sodium: 143 mEq/L (ref 135–145)

## 2010-07-09 LAB — LIPID PANEL
LDL Cholesterol: 72 mg/dL (ref 0–99)
Total CHOL/HDL Ratio: 3

## 2010-07-10 ENCOUNTER — Ambulatory Visit (HOSPITAL_COMMUNITY): Payer: Self-pay

## 2010-07-11 ENCOUNTER — Encounter: Payer: Self-pay | Admitting: Internal Medicine

## 2010-07-12 ENCOUNTER — Ambulatory Visit (HOSPITAL_COMMUNITY): Payer: Self-pay

## 2010-07-15 ENCOUNTER — Encounter: Payer: Self-pay | Admitting: Internal Medicine

## 2010-07-15 ENCOUNTER — Ambulatory Visit (INDEPENDENT_AMBULATORY_CARE_PROVIDER_SITE_OTHER): Payer: 59 | Admitting: Internal Medicine

## 2010-07-15 ENCOUNTER — Ambulatory Visit (HOSPITAL_COMMUNITY): Payer: Self-pay

## 2010-07-15 VITALS — BP 110/70 | HR 41 | Temp 97.6°F | Ht 71.25 in | Wt 193.8 lb

## 2010-07-15 DIAGNOSIS — Z0000 Encounter for general adult medical examination without abnormal findings: Secondary | ICD-10-CM

## 2010-07-15 NOTE — Progress Notes (Signed)
Subjective:    Patient ID: Jonathan Mcintosh, male    DOB: 1960/10/25, 50 y.o.   MRN: [id number]  HPI patient is here today for annual physical. Patient feels well and has no complaints.  Also reviewed chronic medical issues today:  CAD - s/p MI and stent x 2 01/2010 - reports compliance with ongoing medical treatment and no changes in medication dose or frequency. denies adverse side effects related to current therapy. no CP, DOE or brusing - follows closely with cards for same  dyslipidemia - reports compliance with ongoing medical treatment and no changes in medication dose or frequency. denies adverse side effects related to current therapy.   hyperglycemia - elev glc during hosp 01/2010- no personal or FH dm - has lost >10# since MI 01/2010 and follows heart healthy low carb diet  Past Medical History  Diagnosis Date  . ECZEMA   . PSORIASIS   . GLUCOSE INTOLERANCE 01/2010    a1c 6.0  . CORONARY ATHEROSCLEROSIS NATIVE CORONARY ARTERY 01/2010    DES x 2 RCA  . SINUS BRADYCARDIA   . ANXIETY   . DEPRESSION   . GERD   . HYPERLIPIDEMIA   . 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 Heart disease Sister   . Cancer Brother     Prostate  . Hepatitis Other   . Seizures Brother     Isolated   History  Substance Use Topics  . Smoking status: Never Smoker   . Smokeless tobacco: Never Used  . Alcohol Use: No     Review of Systems  Constitutional: Negative for fever.  Respiratory: Negative for cough and shortness of breath.   Cardiovascular: Negative for chest pain.  Gastrointestinal: Negative for abdominal pain.  Musculoskeletal: Negative for gait problem.  Skin: Negative for rash.  Neurological: Negative for dizziness.  No other specific complaints in a complete review of systems (except as listed in HPI above).     Objective:   Physical Exam BP 110/70  Pulse 41  Temp(Src) 97.6 F  (36.4 C) (Oral)  Ht 5' 11.25" (1.81 m)  Wt 193 lb 12.8 oz (87.907 kg)  BMI 26.84 kg/m2  SpO2 97%  Physical Exam  Constitutional:  oriented to person, place, and time. appears well-developed and well-nourished. No distress.  Neck: Normal range of motion. Neck supple. No JVD present. No thyromegaly present.  Cardiovascular: Normal rate, regular rhythm and normal heart sounds.  No murmur heard. Pulmonary/Chest: Effort normal and breath sounds normal. No respiratory distress. no wheezes.  Abdominal: Soft. Bowel sounds are normal. Patient exhibits no distension. There is no tenderness.  Musculoskeletal: Normal range of motion. Patient exhibits no edema.  GU - normal genitalia, rectal tone good, nonpainful - prostate smooth, firm without mass, nodule or enlargement; FOB (-) x 1, brown thin stool Neurological: he is alert and oriented to person, place, and time. No cranial nerve deficit. Coordination normal.  Skin: Skin is warm and dry.  No erythema or ulceration.  Psychiatric: he has a normal mood and affect. behavior is normal. Judgment and thought content normal.   Lab Results  Component Value Date   WBC 8.5 07/09/2010   HGB 15.8 07/09/2010   HCT 46.8 07/09/2010   PLT 191.0 07/09/2010   CHOL 126 07/09/2010   TRIG 80.0 07/09/2010   HDL 38.50* 07/09/2010   LDLDIRECT 189.8 06/28/2009  ALT 32 07/09/2010   AST 23 07/09/2010   NA 143 07/09/2010   K 4.4 07/09/2010   CL 107 07/09/2010   CREATININE 0.9 07/09/2010   BUN 14 07/09/2010   CO2 29 07/09/2010   TSH 1.25 07/09/2010   PSA 0.76 07/09/2010   HGBA1C  Value: 6.0 (NOTE)                                                                       According to the ADA Clinical Practice Recommendations for 2011, when HbA1c is used as a screening test:   >=6.5%   Diagnostic of Diabetes Mellitus           (if abnormal result  is confirmed)  5.7-6.4%   Increased risk of developing Diabetes Mellitus  References:Diagnosis and Classification of Diabetes Mellitus,Diabetes  Care,2011,34(Suppl 1):S62-S69 and Standards of Medical Care in         Diabetes - 2011,Diabetes Care,2011,34  (Suppl 1):S11-S61.* 02/26/2010          Assessment & Plan:  CPX - v70.0 - Patient has been counseled on age-appropriate routine health concerns for screening and prevention. These are reviewed and up-to-date. Immunizations are up-to-date or declined. Labs and ECG reviewed.

## 2010-07-15 NOTE — Patient Instructions (Addendum)
It was good to see you today. We have reviewed your records including labs and tests today - exam looks good Medications reviewed, no changes at this time. Will consider refer to colonoscopy after 12.2012 when >77mo since heart attack Please schedule followup for physical and labs in 12 months, call sooner if problems.

## 2010-07-17 ENCOUNTER — Ambulatory Visit (HOSPITAL_COMMUNITY): Payer: Self-pay

## 2010-07-19 ENCOUNTER — Ambulatory Visit (HOSPITAL_COMMUNITY): Payer: Self-pay

## 2010-07-19 NOTE — Op Note (Signed)
[facility]  Patient:    [name], Jonathan Mcintosh                    MRN: [id number] Adm. Date:  [id number] Disc. Date: [id number] Attending:  Katha Cabal CC:         Laurita Quint, M.D.                           Operative Report  CCS#: [id number]  PREOPERATIVE DIAGNOSIS: Mass, left thigh.  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  OPERATION/PROCEDURE: Excision of mass of left lateral thigh.  SURGEON: [place]. Daphine Deutscher, M.D.  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  He is an active man and as a result he experiences sharp shooting pains and burning pains at times.  Preoperatively he is aware that he may get some sharp shooting pains postoperatively as well as have numbness possibly after this area is removed.  Preoperative MRI suggested this is a hemangioma.  DESCRIPTION OF PROCEDURE: The patient was taken to operating room 7 on August 12, 1999 and given general anesthesia.  The leg was prepped with Betadine and draped sterilely.  I had previously marked it.  I made a longitudinal incision and went down and incised the iliotibial tract.  This was displacing the vastus lateralis and I went ahead and could see dilated vessels and huge capillaries, and went ahead and stayed around that and dissected around it.  I went through the fatty tissue surrounding it, clamping that and tieing each of these with 4-0 Vicryl.  As such I slowly mobilized this lesion and did not encounter any bleeding at all.  Once it was totally mobilized I pulled it up an then completed the excision of the stalk, again ligating all small connections with 4-0 Vicryl.  Three reels of Vicryl 4-0 were used in securing this.  Once it was removed it was sent for permanent sections.  There was no bleeding in the wound.   I irrigated with saline and then closed the iliotibial tract fascia with interrupted 4-0 and interrupted 2-0 Vicryl.  A 4-0 Vicryl was used in the subcutaneous tissue.  The skin was approximated with 4-0 Prolene as well as Benzoin and Steri-Strips.  The patient tolerated the procedure well.  He will be given Tylox for pain and will be followed up for suture removal in ten days. DD:  08/12/99 TD:  08/14/99 Job: [id number]

## 2010-07-22 ENCOUNTER — Ambulatory Visit (HOSPITAL_COMMUNITY): Payer: Self-pay

## 2010-07-23 ENCOUNTER — Encounter: Payer: Self-pay | Admitting: Cardiology

## 2010-07-24 ENCOUNTER — Ambulatory Visit (HOSPITAL_COMMUNITY): Payer: Self-pay

## 2010-07-24 ENCOUNTER — Ambulatory Visit (INDEPENDENT_AMBULATORY_CARE_PROVIDER_SITE_OTHER): Payer: 59 | Admitting: Cardiology

## 2010-07-24 VITALS — BP 126/80 | HR 48 | Ht 72.0 in | Wt 198.0 lb

## 2010-07-24 DIAGNOSIS — E785 Hyperlipidemia, unspecified: Secondary | ICD-10-CM

## 2010-07-24 DIAGNOSIS — I2510 Atherosclerotic heart disease of native coronary artery without angina pectoris: Secondary | ICD-10-CM

## 2010-07-24 NOTE — Patient Instructions (Signed)
Your physician recommends that you schedule a follow-up appointment in: 2 MONTHS  Your physician recommends that you continue on your current medications as directed. Please refer to the Current Medication list given to you today.   

## 2010-07-26 ENCOUNTER — Ambulatory Visit (HOSPITAL_COMMUNITY): Payer: Self-pay

## 2010-07-29 ENCOUNTER — Ambulatory Visit (HOSPITAL_COMMUNITY): Payer: Self-pay

## 2010-07-31 ENCOUNTER — Ambulatory Visit (HOSPITAL_COMMUNITY): Payer: Self-pay

## 2010-08-02 ENCOUNTER — Ambulatory Visit (HOSPITAL_COMMUNITY): Payer: Self-pay

## 2010-08-04 NOTE — Progress Notes (Signed)
HPI:  Doing really well. About one and one half months ago had episode of pain.  He feels great when he is active.  Has done well in cardiac rehab.  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  No further symptoms.  No unusual bleeding.  Current Outpatient Prescriptions  Medication Sig Dispense Refill  . aspirin 81 MG tablet Take 81 mg by mouth daily.        . Cholecalciferol (VITAMIN D-3 PO) Take 1 tablet by mouth daily.        . metoprolol tartrate (LOPRESSOR) 25 MG tablet Take 1 tablet (25 mg total) by mouth 2 (two) times daily.  30 tablet  6  . nitroGLYCERIN (NITROSTAT) 0.4 MG SL tablet Place 0.4 mg under the tongue every 5 (five) minutes as needed. Up to 3 doses       . Omega-3 Fatty Acids (FISH OIL) 1000 MG CAPS Take by mouth daily.        Marland Kitchen omeprazole (PRILOSEC OTC) 20 MG tablet Take 20 mg by mouth daily.        . prasugrel (EFFIENT) 10 MG TABS Take by mouth.        . rosuvastatin (CRESTOR) 40 MG tablet Take 40 mg by mouth daily.          No Known Allergies  Past Medical History  Diagnosis Date  . ECZEMA   . PSORIASIS   . GLUCOSE INTOLERANCE 01/2010    a1c 6.0  . CORONARY ATHEROSCLEROSIS NATIVE CORONARY ARTERY 01/2010    DES x 2 RCA  . SINUS BRADYCARDIA   . ANXIETY   . DEPRESSION   . GERD   . HYPERLIPIDEMIA   . 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 Mass excision (l) thigh 08/2009    AVM    Family History  Problem Relation Age of Onset  . Heart disease Sister   . Cancer Brother     Prostate  . Hepatitis Other   . Seizures Brother     Isolated    History   Social History  . Marital Status: Married    Spouse Name: N/A    Number of Children: N/A  . Years of Education: N/A   Occupational History  . Not on file.   Social History  Main Topics  . Smoking status: Never Smoker   . Smokeless tobacco: Never Used  . Alcohol Use: No  . Drug Use: No  . Sexually Active: Not on file   Other Topics Concern  . Not on file   Social History Narrative  . No narrative on file    ROS: Please see the HPI.  All other systems reviewed and negative.  PHYSICAL EXAM:  BP 126/80  Pulse 48  Ht 6' (1.829 m)  Wt 198 lb (89.812 kg)  BMI 26.85 kg/m2  General: Well developed, well nourished, in no acute distress. Head:  Normocephalic and atraumatic. Neck: no JVD Lungs: Clear to auscultation and percussion. Heart: Normal S1 and S2.  No murmur, rubs or gallops.  Abdomen:  Normal bowel sounds; soft; non tender; no organomegaly Pulses: Pulses normal in all 4 extremities. Extremities: No clubbing or cyanosis. No edema. Neurologic: Alert and oriented x 3.  EKG:  SB, otherwise normal.   ASSESSMENT AND PLAN:

## 2010-08-04 NOTE — Assessment & Plan Note (Addendum)
Continue to monitor current status.  If there is a change in symptoms, he knows to let us know right away.  See cath and nuclear study information.  He should remain on prasugrel for one year, then ASA continuously.  No change in that plan.  Reminded regarding potential risk for bleeding.

## 2010-08-04 NOTE — Assessment & Plan Note (Signed)
LDL down to near target.  Last LDL 72.  Continue rosuvastatin and fish oil at present.

## 2010-08-05 ENCOUNTER — Ambulatory Visit (HOSPITAL_COMMUNITY): Payer: Self-pay

## 2010-08-07 ENCOUNTER — Ambulatory Visit (HOSPITAL_COMMUNITY): Payer: Self-pay

## 2010-08-09 ENCOUNTER — Ambulatory Visit (HOSPITAL_COMMUNITY): Payer: Self-pay

## 2010-09-09 ENCOUNTER — Other Ambulatory Visit: Payer: Self-pay | Admitting: *Deleted

## 2010-09-09 MED ORDER — ROSUVASTATIN CALCIUM 40 MG PO TABS: 40.0000 mg | ORAL_TABLET | Freq: Every day | ORAL | Status: DC

## 2010-09-09 MED ORDER — PRASUGREL HCL 10 MG PO TABS: 10.0000 mg | ORAL_TABLET | Freq: Every day | ORAL | Status: DC

## 2010-09-27 ENCOUNTER — Ambulatory Visit: Payer: 59 | Admitting: Cardiology

## 2010-10-03 ENCOUNTER — Ambulatory Visit (INDEPENDENT_AMBULATORY_CARE_PROVIDER_SITE_OTHER): Payer: 59 | Admitting: Cardiology

## 2010-10-03 ENCOUNTER — Encounter: Payer: Self-pay | Admitting: Cardiology

## 2010-10-03 DIAGNOSIS — E785 Hyperlipidemia, unspecified: Secondary | ICD-10-CM

## 2010-10-03 DIAGNOSIS — I2510 Atherosclerotic heart disease of native coronary artery without angina pectoris: Secondary | ICD-10-CM

## 2010-10-03 NOTE — Assessment & Plan Note (Signed)
Doing well from a cardiac standpoint.  Will see him back in about six months in the absence of further symptoms.  He knows to call if there is a change in status.

## 2010-10-03 NOTE — Progress Notes (Signed)
HPI:  Patient is in for follow up.  He is doing well.  He denies any chest pain.  He did have a scare when his wife was out of town.  He was out with his daughter, had a [place] meal, and then developed nausea, vomiting, and diarrhea.  He is now improved and doing much better.  EMS came to the house.  He has not had any chest pain or specific cardiac symptoms.    Current Outpatient Prescriptions  Medication Sig Dispense Refill  . aspirin 81 MG tablet Take 81 mg by mouth daily.        . Cholecalciferol (VITAMIN D-3 PO) Take 1 tablet by mouth daily.        . metoprolol tartrate (LOPRESSOR) 25 MG tablet Take 1 tablet (25 mg total) by mouth 2 (two) times daily.  30 tablet  6  . nitroGLYCERIN (NITROSTAT) 0.4 MG SL tablet Place 0.4 mg under the tongue every 5 (five) minutes as needed. Up to 3 doses       . Omega-3 Fatty Acids (FISH OIL) 1000 MG CAPS Take by mouth daily.        Marland Kitchen omeprazole (PRILOSEC OTC) 20 MG tablet Take 20 mg by mouth daily.        . prasugrel (EFFIENT) 10 MG TABS Take 1 tablet (10 mg total) by mouth daily.  30 tablet  6  . rosuvastatin (CRESTOR) 40 MG tablet Take 1 tablet (40 mg total) by mouth daily.  30 tablet  6    No Known Allergies  Past Medical History  Diagnosis Date  . ECZEMA   . PSORIASIS   . GLUCOSE INTOLERANCE 01/2010    a1c 6.0  . CORONARY ATHEROSCLEROSIS NATIVE CORONARY ARTERY 01/2010    DES x 2 RCA  . SINUS BRADYCARDIA   . ANXIETY   . DEPRESSION   . GERD   . HYPERLIPIDEMIA   . 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 Mass excision (l) thigh 08/2009    AVM    Family History  Problem Relation Age of Onset  . Heart disease Sister   . Cancer Brother     Prostate  . Hepatitis Other   . Seizures Brother     Isolated    History   Social History  . Marital Status:  Married    Spouse Name: N/A    Number of Children: N/A  . Years of Education: N/A   Occupational History  . Not on file.   Social History Main Topics  . Smoking status: Never Smoker   . Smokeless tobacco: Never Used  . Alcohol Use: No  . Drug Use: No  . Sexually Active: Not on file   Other Topics Concern  . Not on file   Social History Narrative  . No narrative on file    ROS: Please see the HPI.  All other systems reviewed and negative.  PHYSICAL EXAM:  BP 110/84  Pulse 62  Resp 18  Ht 6' (1.829 m)  Wt 191 lb 12.8 oz (87 kg)  BMI 26.01 kg/m2  General: Well developed, well nourished, in no acute distress. Head:  Normocephalic and atraumatic. Neck: no JVD Lungs: Clear to auscultation and percussion. Heart: Normal S1 and S2.  No  murmur, rubs or gallops.  Pulses: Pulses normal in all 4 extremities. Extremities: No clubbing or cyanosis. No edema. Neurologic: Alert and oriented x 3.  EKG:  NSR.  WNL.   ASSESSMENT AND PLAN:

## 2010-10-03 NOTE — Assessment & Plan Note (Signed)
He has had labs with Dr. Felicity Coyer.  We think his management needs to be fairly aggressive to get plaque reduction, as he has a fair amount of plaque burden despite not having many bad habits.  His weight is stable.  His LDL is just above target.  Will continue on therapy.

## 2011-01-27 ENCOUNTER — Telehealth: Payer: Self-pay | Admitting: Cardiology

## 2011-01-27 NOTE — Telephone Encounter (Signed)
New Msg: Fleet Contras from [facility] calling to check on status of receipt of fax. Pt wants to begin weight loss program and needs either approval or disapproval to proceeded ASAP. Please return call to discuss further if necessary.

## 2011-01-27 NOTE — Telephone Encounter (Signed)
I spoke with Fleet Contras and made her aware that Dr [name] did not approve this program for the patient. Dr [name] recommends that the pt have a nutrition consult at [facility].

## 2011-02-04 NOTE — Telephone Encounter (Signed)
I attempted to reach the pt but there was no answer at home and cell phone voicemail is full.

## 2011-02-26 NOTE — Telephone Encounter (Signed)
I spoke with the pt and he actually got involved with a Heart Healthy diet program and has lost about 10 pounds since making his dietary changes.  The pt will continue with his diet and follow-up with Dr [name] in January.

## 2011-03-15 IMAGING — CR DG HAND COMPLETE 3+V*R*
3 series · 3 of 3 positions shown · non-contrast
Comparison: Prior today.

CLINICAL DATA: Severe laceration of the hand with [place] saw.
Amputation, pain, and swelling.

RIGHT HAND - COMPLETE 3+ VIEW

[view not recorded (1 of 3)]
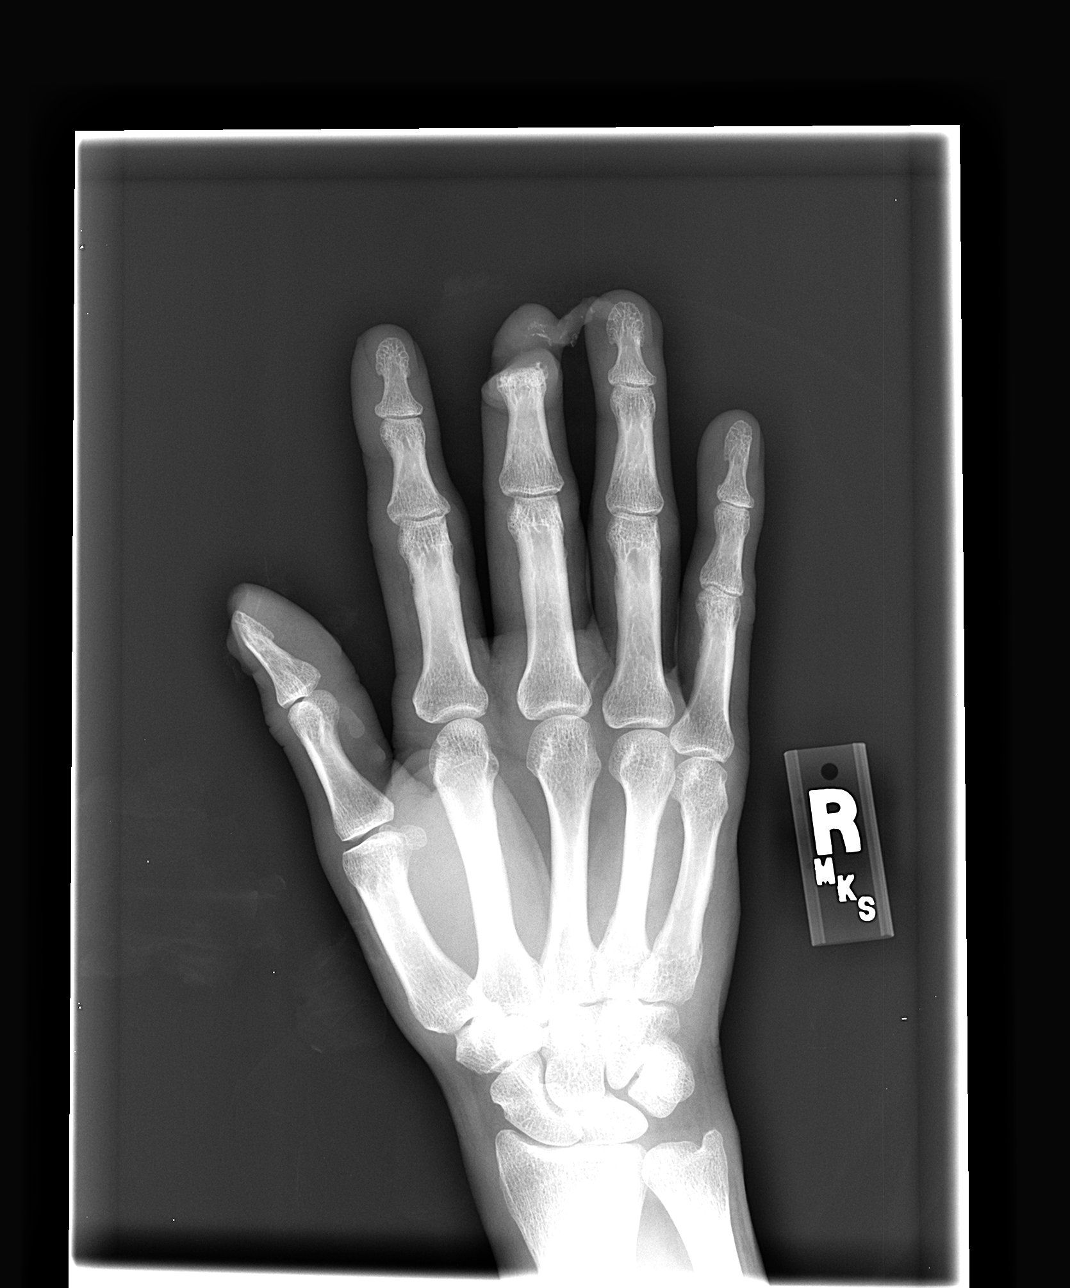

[view not recorded (2 of 3)]
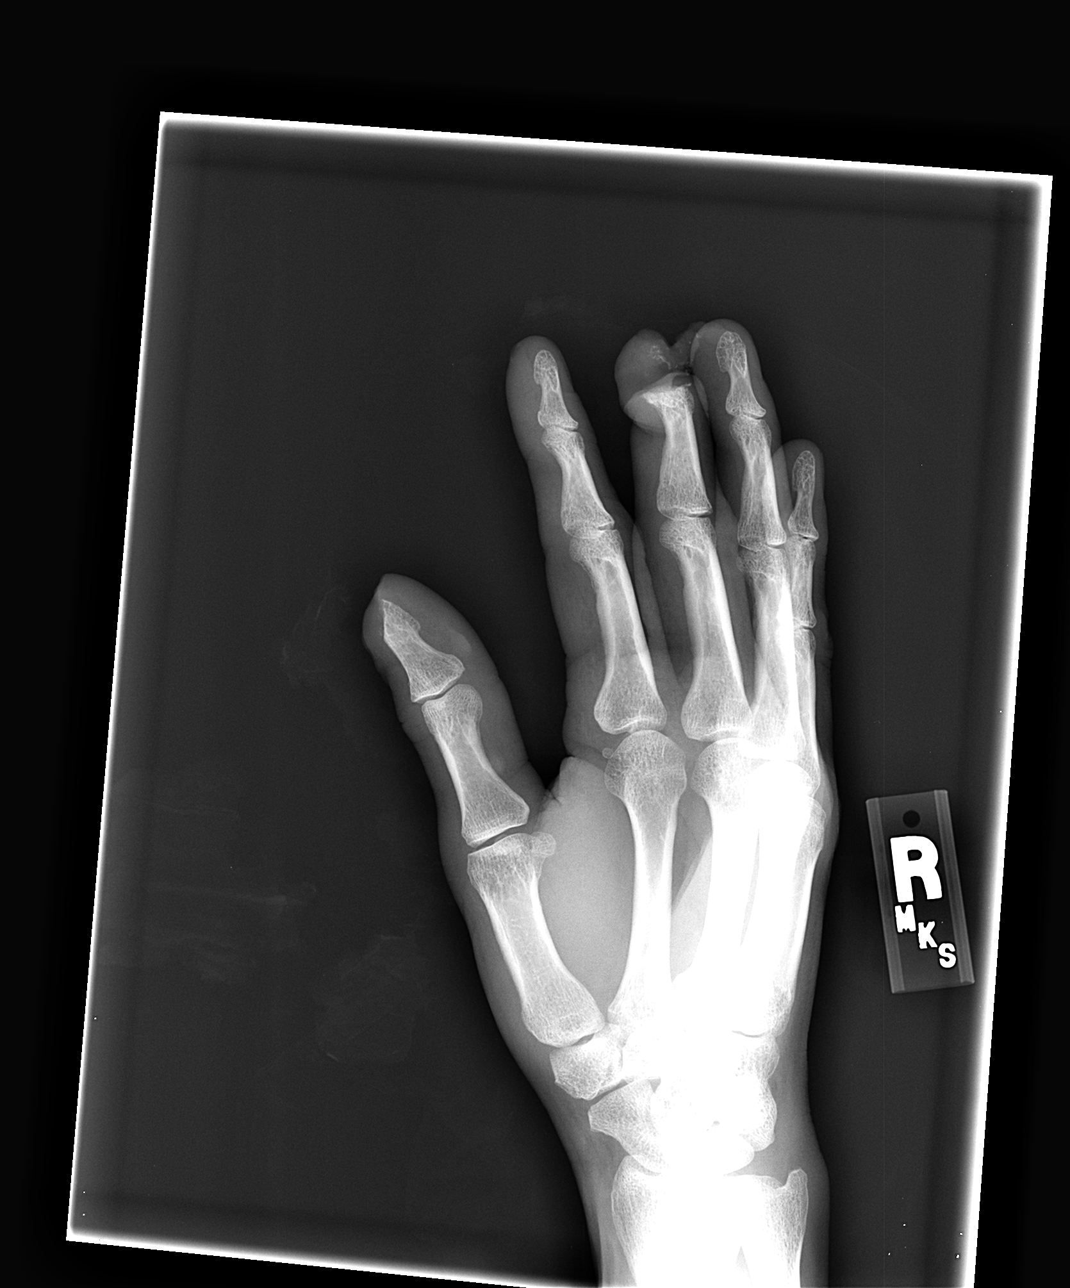

[view not recorded (3 of 3)]
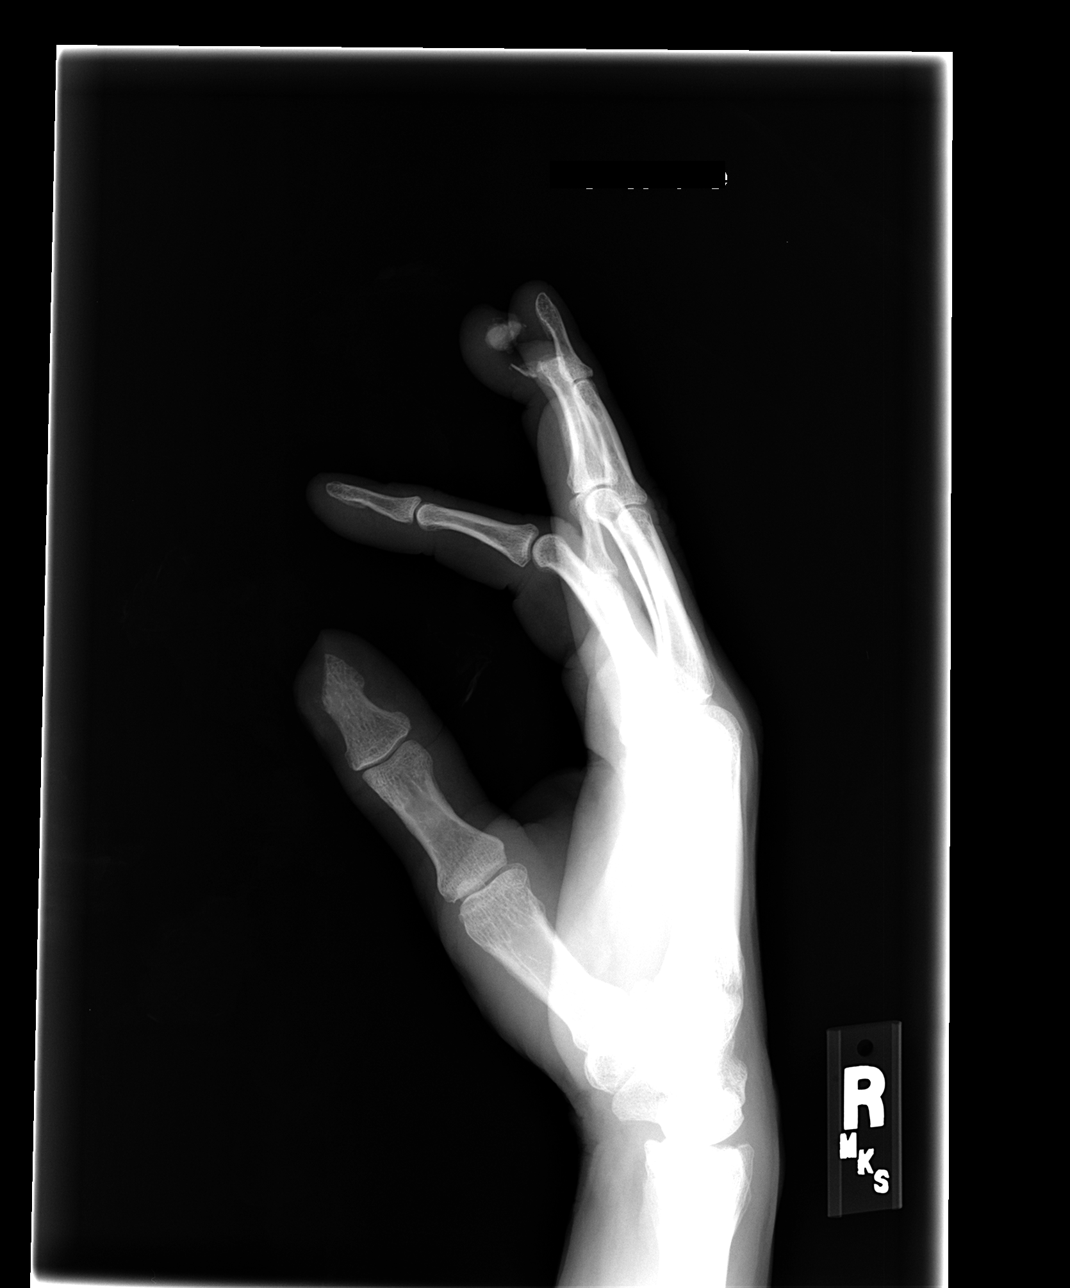

[3 of 3 positions shown; findings below may reference images not displayed]

FINDINGS: Bandages have been removed, and better positioning is
noted.  Amputation of the distal phalanx of the middle finger is
seen, as well as fracture involving the head of the middle phalanx.

Partial amputation of the tuft of the distal phalanx of the thumb
is also noted.  No evidence of dislocation.
IMPRESSION: 1.  Amputation of the distal phalanx of the middle finger, and
fracture involving the head of the middle phalanx.
2.  Partial amputation of the tuft of the distal phalanx of the
thumb.

## 2011-03-26 ENCOUNTER — Encounter: Payer: Self-pay | Admitting: Cardiology

## 2011-03-26 ENCOUNTER — Ambulatory Visit (INDEPENDENT_AMBULATORY_CARE_PROVIDER_SITE_OTHER): Payer: 59 | Admitting: Cardiology

## 2011-03-26 DIAGNOSIS — I2510 Atherosclerotic heart disease of native coronary artery without angina pectoris: Secondary | ICD-10-CM

## 2011-03-26 DIAGNOSIS — I495 Sick sinus syndrome: Secondary | ICD-10-CM

## 2011-03-26 DIAGNOSIS — E7800 Pure hypercholesterolemia, unspecified: Secondary | ICD-10-CM

## 2011-03-26 MED ORDER — METOPROLOL TARTRATE 25 MG PO TABS: 12.5000 mg | ORAL_TABLET | Freq: Two times a day (BID) | ORAL | Status: DC

## 2011-03-26 NOTE — Assessment & Plan Note (Signed)
Doing great at this point in time.  More than one year out, and second generation stent used  (Promus).  Therefore, DC Effient.  Risks and options discussed with patient in detail, and data regarding very late ST reviewed.    Continue medical therapy.

## 2011-03-26 NOTE — Progress Notes (Signed)
HPI:  Doing extremely well. Very active.  Was on ladder the other day.  We talked at great length about use of DAPT beyond one year. He expressed understanding.  No chest pain.  Feels good.  No presyncope..   Current Outpatient Prescriptions  Medication Sig Dispense Refill  . aspirin 81 MG tablet Take 81 mg by mouth daily.        . Cholecalciferol (VITAMIN D-3 PO) Take 1 tablet by mouth daily.        . metoprolol tartrate (LOPRESSOR) 25 MG tablet Take 1 tablet (25 mg total) by mouth 2 (two) times daily.  30 tablet  6  . nitroGLYCERIN (NITROSTAT) 0.4 MG SL tablet Place 0.4 mg under the tongue every 5 (five) minutes as needed. Up to 3 doses       . Omega-3 Fatty Acids (FISH OIL) 1000 MG CAPS Take by mouth daily.        Marland Kitchen omeprazole (PRILOSEC OTC) 20 MG tablet Take 10 mg by mouth daily.       . prasugrel (EFFIENT) 10 MG TABS Take 1 tablet (10 mg total) by mouth daily.  30 tablet  6  . rosuvastatin (CRESTOR) 40 MG tablet Take 1 tablet (40 mg total) by mouth daily.  30 tablet  6    No Known Allergies  Past Medical History  Diagnosis Date  . ECZEMA   . PSORIASIS   . GLUCOSE INTOLERANCE 01/2010    a1c 6.0  . CORONARY ATHEROSCLEROSIS NATIVE CORONARY ARTERY 01/2010    DES x 2 RCA  . SINUS BRADYCARDIA   . ANXIETY   . DEPRESSION   . GERD   . HYPERLIPIDEMIA   . 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 Mass excision (l) thigh 08/2009    AVM    Family History  Problem Relation Age of Onset  . Heart disease Sister   . Cancer Brother     Prostate  . Hepatitis Other   . Seizures Brother     Isolated    History   Social History  . Marital Status: Married    Spouse Name: N/A    Number of Children: N/A  . Years of Education: N/A   Occupational History  . Not on file.   Social History Main Topics    . Smoking status: Never Smoker   . Smokeless tobacco: Never Used  . Alcohol Use: No  . Drug Use: No  . Sexually Active: Not on file   Other Topics Concern  . Not on file   Social History Narrative  . No narrative on file    ROS: Please see the HPI.  All other systems reviewed and negative.  PHYSICAL EXAM:  BP 128/78  Pulse 45  Ht 6' (1.829 m)  Wt 87.272 kg (192 lb 6.4 oz)  BMI 26.09 kg/m2  General: Well developed, well nourished, in no acute distress. Head:  Normocephalic and atraumatic. Neck: no JVD Lungs: Clear to auscultation and percussion. Heart: Normal S1 and S2.  No murmur, rubs or gallops.  Abdomen:  Normal bowel sounds; soft; non tender; no organomegaly Pulses: Pulses normal in all 4 extremities. Extremities: No clubbing or cyanosis. No edema. Neurologic: Alert and oriented x 3.  EKG:  SB.  Otherwise normal.    ASSESSMENT AND PLAN:

## 2011-03-26 NOTE — Patient Instructions (Addendum)
Your physician has recommended you make the following change in your medication: STOP Effient, DECREASE Metoprolol Tartrate to 25mg  take one-half tabley by mouth twice a day  Your physician recommends that you schedule a follow-up appointment in: 4 MONTHS  Your physician has requested that you regularly monitor and record your blood pressure readings at home. Please use the same machine at the same time of day to check your readings and record them to bring to your follow-up visit. Please call the office if your BP is running higher than 140/90.

## 2011-03-26 NOTE — Assessment & Plan Note (Signed)
Will reduced metoprolol to 12.5 mg twice daily.

## 2011-03-26 NOTE — Assessment & Plan Note (Signed)
Focused on diet and last LDL was 72mg /dl.  Discussed REVERSAL and ASTEROID.  Continue aggressive management.

## 2011-05-21 ENCOUNTER — Other Ambulatory Visit: Payer: Self-pay | Admitting: Cardiology

## 2011-06-20 ENCOUNTER — Encounter (HOSPITAL_COMMUNITY): Payer: Self-pay | Admitting: Emergency Medicine

## 2011-06-20 ENCOUNTER — Emergency Department (HOSPITAL_COMMUNITY)
Admission: EM | Admit: 2011-06-20 | Discharge: 2011-06-21 | Disposition: A | Discharge status: Home / Self Care | Payer: 59 | Attending: Emergency Medicine | Admitting: Emergency Medicine

## 2011-06-20 ENCOUNTER — Emergency Department (HOSPITAL_COMMUNITY): Payer: 59

## 2011-06-20 DIAGNOSIS — F341 Dysthymic disorder: Secondary | ICD-10-CM | POA: Insufficient documentation

## 2011-06-20 DIAGNOSIS — E785 Hyperlipidemia, unspecified: Secondary | ICD-10-CM | POA: Insufficient documentation

## 2011-06-20 DIAGNOSIS — D72829 Elevated white blood cell count, unspecified: Secondary | ICD-10-CM

## 2011-06-20 LAB — CBC
HCT: 42.3 % (ref 39.0–52.0)
MCH: 27.3 pg (ref 26.0–34.0)
MCV: 78.5 fL (ref 78.0–100.0)
RDW: 12.8 % (ref 11.5–15.5)
WBC: 14.2 10*3/uL — ABNORMAL HIGH (ref 4.0–10.5)

## 2011-06-20 LAB — D-DIMER, QUANTITATIVE: D-Dimer, Quant: 0.23 ug/mL-FEU (ref 0.00–0.48)

## 2011-06-20 MED ORDER — SODIUM CHLORIDE 0.9 % IV SOLN
INTRAVENOUS | Status: DC
  Administered 2011-06-20: 23:00:00 via INTRAVENOUS

## 2011-06-20 NOTE — ED Provider Notes (Signed)
History     CSN: [id number]  Arrival date & time 06/20/11  2153   First MD Initiated Contact with Patient 06/20/11 2301      Chief Complaint  Patient presents with  . Chills    (Consider location/radiation/quality/duration/timing/severity/associated sxs/prior treatment) The history is provided by the patient and the spouse.   the patient is a 51 year old, male, with a history of a heart attack in December 2011, which was treated with 2 stents.  He presents to emergency department complaining of chills.  This evening along with a "twinge" on the right side of his chest.  He twinges have been present for approximately one week.  He denies nausea, vomiting, fevers, cough, shortness of breath, leg pain or swelling.  His wife states that he has been having sweating at night time.  For the past week.  She states that these were some of his symptoms.  Prior to his heart attack.  He has no pain at all.  At this time.  Past Medical History  Diagnosis Date  . ECZEMA   . PSORIASIS   . GLUCOSE INTOLERANCE 01/2010    a1c 6.0  . CORONARY ATHEROSCLEROSIS NATIVE CORONARY ARTERY 01/2010    DES x 2 RCA  . SINUS BRADYCARDIA   . ANXIETY   . DEPRESSION   . GERD   . HYPERLIPIDEMIA   . 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 Mass excision (l) thigh 08/2009    AVM    Family History  Problem Relation Age of Onset  . Heart disease Sister   . Cancer Brother     Prostate  . Hepatitis Other   . Seizures Brother     Isolated    History  Substance Use Topics  . Smoking status: Never Smoker   . Smokeless tobacco: Never Used  . Alcohol Use: No      Review of Systems  Constitutional: Positive for chills. Negative for fever and diaphoresis.  HENT: Negative for congestion.   Respiratory: Negative for chest  tightness.   Cardiovascular: Negative for chest pain, palpitations and leg swelling.       Right sided "twinge"  Gastrointestinal: Negative for nausea and vomiting.  Neurological: Negative for headaches.  Psychiatric/Behavioral: Negative for confusion.  All other systems reviewed and are negative.    Allergies  Review of patient's allergies indicates no known allergies.  Home Medications   Current Outpatient Rx  Name Route Sig Dispense Refill  . ASPIRIN 81 MG PO TABS Oral Take 81 mg by mouth daily.      Marland Kitchen VITAMIN D-3 PO Oral Take 1 tablet by mouth daily.      . CRESTOR 40 MG PO TABS  TAKE 1 TABLET BY MOUTH EVERY DAY 30 tablet 5  . METOPROLOL TARTRATE 25 MG PO TABS Oral Take 0.5 tablets (12.5 mg total) by mouth 2 (two) times daily. 90 tablet 3  . NITROGLYCERIN 0.4 MG SL SUBL Sublingual Place 0.4 mg under the tongue every 5 (five) minutes as needed. Up to 3 doses     . FISH OIL 1000 MG PO CAPS Oral Take by mouth daily.      Marland Kitchen OMEPRAZOLE MAGNESIUM 20 MG PO TBEC Oral Take 10 mg by mouth daily.  BP 123/81  Pulse 67  Temp(Src) 98.2 F (36.8 C) (Oral)  Resp 14  SpO2 91%  Physical Exam  Vitals reviewed. Constitutional: He is oriented to person, place, and time. He appears well-developed and well-nourished.  HENT:  Head: Normocephalic and atraumatic.  Eyes: Conjunctivae are normal.  Neck: Normal range of motion.  Cardiovascular: Normal rate.   Murmur heard. Pulmonary/Chest: Effort normal. No respiratory distress. He has no wheezes. He has no rales.  Abdominal: Soft. He exhibits no distension. There is no tenderness.  Musculoskeletal: Normal range of motion. He exhibits no edema and no tenderness.  Neurological: He is alert and oriented to person, place, and time.  Skin: Skin is warm and dry.  Psychiatric: He has a normal mood and affect. Thought content normal.    ED Course  Procedures (including critical care time) 51 year old, male, with chills.  This evening, along  with right-sided "twinge" in room air.  Mild hypoxia.  His physical examination is normal.  Because of his low oxygen and chills.  We will perform a chest x-ray, to look for evidence of pneumonia.  Since his wife says he had similar symptoms.  Prior to his heart attack.  I will also perform a troponin however, I doubt that he has ACS.  At this time.  And, although he has no risk factors for pulmonary embolism.  I will do a d-dimer, in an effort to explain his room air, hypoxia   Labs Reviewed  CBC  BASIC METABOLIC PANEL  D-DIMER, QUANTITATIVE   No results found.    Date: 06/20/2011  Rate: 68  Rhythm: normal sinus rhythm  QRS Axis: normal  Intervals: normal  ST/T Wave abnormalities: normal  Conduction Disutrbances: none  Narrative Interpretation: unremarkable  1:56 AM Remains asx in no distress    MDM  Chills, with room air, hypoxia Leukocytosis. No pneumonia. No resp distress, no uti.  No evidence acs.         Cheri Guppy, MD 06/21/11 [phone]

## 2011-06-20 NOTE — ED Notes (Signed)
Patient with chills and "shakes" approx one hour after eating.  Patient states he was having right upper chest pain.  Patient is on monitor, NSR, rate of 78.  Patient states he is having some "twinge" of a sensation in his right upper chest.  Some nausea associated.

## 2011-06-20 NOTE — ED Notes (Signed)
Patient with chills that started approx 1 hour ago after going out to eat.  Patient having right upper chest pain.  Patient states that when he had his MI, that is where the pain was.  No shortness of breath , no diaphoresis.  Patient took 81mg  of ASA this AM, was given one sl nitro enroute to ED.  Patient is pain free at this time

## 2011-06-21 LAB — BASIC METABOLIC PANEL
BUN: 14 mg/dL (ref 6–23)
CO2: 24 mEq/L (ref 19–32)
Calcium: 9.4 mg/dL (ref 8.4–10.5)
Chloride: 103 mEq/L (ref 96–112)
Creatinine, Ser: 0.86 mg/dL (ref 0.50–1.35)
GFR calc Af Amer: >90 mL/min (ref >90)

## 2011-06-21 LAB — URINALYSIS, ROUTINE W REFLEX MICROSCOPIC
Glucose, UA: NEGATIVE mg/dL
Hgb urine dipstick: NEGATIVE
Leukocytes, UA: NEGATIVE
Protein, ur: NEGATIVE mg/dL
pH: 6 (ref 5.0–8.0)

## 2011-06-21 NOTE — Discharge Instructions (Signed)
Your EKG, and blood tests do not show any signs of a heart attack or irregular heartbeat.  Your chest x-ray does not show pneumonia.  Your urine does not show signs of an infection.  Use Tylenol or Motrin for fever or pain.  Followup with your doctor next week.  If your symptoms persist.  Return for worse or uncontrolled symptoms

## 2011-06-26 ENCOUNTER — Encounter: Payer: Self-pay | Admitting: Internal Medicine

## 2011-06-26 ENCOUNTER — Ambulatory Visit (INDEPENDENT_AMBULATORY_CARE_PROVIDER_SITE_OTHER): Payer: 59 | Admitting: Internal Medicine

## 2011-06-26 ENCOUNTER — Other Ambulatory Visit (INDEPENDENT_AMBULATORY_CARE_PROVIDER_SITE_OTHER): Payer: 59

## 2011-06-26 VITALS — BP 108/68 | HR 46 | Temp 98.1°F | Ht 72.0 in | Wt 180.8 lb

## 2011-06-26 DIAGNOSIS — R7309 Other abnormal glucose: Secondary | ICD-10-CM

## 2011-06-26 DIAGNOSIS — D72829 Elevated white blood cell count, unspecified: Secondary | ICD-10-CM

## 2011-06-26 DIAGNOSIS — D239 Other benign neoplasm of skin, unspecified: Secondary | ICD-10-CM

## 2011-06-26 DIAGNOSIS — B351 Tinea unguium: Secondary | ICD-10-CM

## 2011-06-26 DIAGNOSIS — D229 Melanocytic nevi, unspecified: Secondary | ICD-10-CM

## 2011-06-26 DIAGNOSIS — R7303 Prediabetes: Secondary | ICD-10-CM

## 2011-06-26 DIAGNOSIS — Z0000 Encounter for general adult medical examination without abnormal findings: Secondary | ICD-10-CM

## 2011-06-26 LAB — URINALYSIS
Ketones, ur: NEGATIVE
Leukocytes, UA: NEGATIVE
Specific Gravity, Urine: 1.015 (ref 1.000–1.030)
Urine Glucose: NEGATIVE
Urobilinogen, UA: 0.2 (ref 0.0–1.0)
pH: 7.5 (ref 5.0–8.0)

## 2011-06-26 LAB — CBC WITH DIFFERENTIAL/PLATELET
Basophils Absolute: 0 10*3/uL (ref 0.0–0.1)
Eosinophils Relative: 1.4 % (ref 0.0–5.0)
HCT: 46.3 % (ref 39.0–52.0)
Hemoglobin: 15.6 g/dL (ref 13.0–17.0)
Lymphocytes Relative: 35 % (ref 12.0–46.0)
Lymphs Abs: 2.8 10*3/uL (ref 0.7–4.0)
Monocytes Relative: 4.4 % (ref 3.0–12.0)
Neutro Abs: 4.6 10*3/uL (ref 1.4–7.7)
RBC: 5.64 Mil/uL (ref 4.22–5.81)
RDW: 13 % (ref 11.5–14.6)
WBC: 7.9 10*3/uL (ref 4.5–10.5)

## 2011-06-26 LAB — LIPID PANEL
Cholesterol: 136 mg/dL (ref 0–200)
HDL: 46.4 mg/dL (ref >39.00)
Triglycerides: 63 mg/dL (ref 0.0–149.0)
VLDL: 12.6 mg/dL (ref 0.0–40.0)

## 2011-06-26 LAB — HEPATIC FUNCTION PANEL
ALT: 26 U/L (ref 0–53)
AST: 19 U/L (ref 0–37)
Albumin: 4.5 g/dL (ref 3.5–5.2)
Total Bilirubin: 0.9 mg/dL (ref 0.3–1.2)
Total Protein: 7.1 g/dL (ref 6.0–8.3)

## 2011-06-26 LAB — SEDIMENTATION RATE: Sed Rate: 7 mm/hr (ref 0–22)

## 2011-06-26 LAB — HEMOGLOBIN A1C: Hgb A1c MFr Bld: 5.9 % (ref 4.6–6.5)

## 2011-06-26 LAB — BASIC METABOLIC PANEL
Calcium: 9.6 mg/dL (ref 8.4–10.5)
GFR: 89.51 mL/min (ref >60.00)
Potassium: 4.2 mEq/L (ref 3.5–5.1)
Sodium: 141 mEq/L (ref 135–145)

## 2011-06-26 LAB — TSH: TSH: 1.2 u[IU]/mL (ref 0.35–5.50)

## 2011-06-26 LAB — PSA: PSA: 0.78 ng/mL (ref 0.10–4.00)

## 2011-06-26 MED ORDER — CICLOPIROX 8 % EX SOLN: Freq: Every day | CUTANEOUS | Status: DC

## 2011-06-26 NOTE — Patient Instructions (Signed)
It was good to see you today. We have reviewed your prior records including recent labs and tests today Your exam nd EKG look great Health Maintenance reviewed - will refer for colonoscopy -all other recommended immunizations and age-appropriate screenings are up-to-date.  Test(s) ordered today. Your results will be called to you after review (48-72hours after test completion). If any changes need to be made, you will be notified at that time. we'll make referral to Dr Christella Hartigan for colonoscopy screening and to dermatology for skin check. Our office will contact you regarding appointment(s) once made. Medications reviewed, no changes at this time. Use PenLac for nail fungus as discussed Your prescription(s) have been submitted to your pharmacy. Please take as directed and contact our office if you believe you are having problem(s) with the medication(s). Please schedule followup in 1 year for annual physical and labs, call sooner if problems.

## 2011-06-26 NOTE — Progress Notes (Signed)
Subjective:    Patient ID: Jonathan Mcintosh, male    DOB: 09-04-1960, 51 y.o.   MRN: [id number]  HPI patient is here today for annual physical. Patient feels well overall.  Concerned about ?mole change on chest  Also on antibiotics for dental infection - felt chilled last week, improved slowly - no fever, rash, nausea and vomiting or bowel change -  Past Medical History  Diagnosis Date  . ECZEMA   . PSORIASIS   . GLUCOSE INTOLERANCE 01/2010    a1c 6.0  . CORONARY ATHEROSCLEROSIS NATIVE CORONARY ARTERY 01/2010    DES x 2 RCA  . SINUS BRADYCARDIA   . ANXIETY   . DEPRESSION   . GERD   . HYPERLIPIDEMIA   . 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 Heart disease Sister   . Cancer Brother     Prostate  . Hepatitis Other   . Seizures Brother     Isolated   History  Substance Use Topics  . Smoking status: Never Smoker   . Smokeless tobacco: Never Used  . Alcohol Use: No    Review of Systems Constitutional: Negative for fever or unintentional weight change - working with nutritionist since 11/12. "tremor" and chill like sensation last week, on Amox for dental infection Respiratory: Negative for cough and shortness of breath.   Cardiovascular: Negative for chest pain or palpitations.  Gastrointestinal: Negative for abdominal pain, no bowel changes.  Musculoskeletal: Negative for gait problem or joint swelling.  Skin: Negative for rash.  Neurological: Negative for dizziness or headache.  No other specific complaints in a complete review of systems (except as listed in HPI above).     Objective:   Physical Exam BP 108/68  Pulse 46  Temp(Src) 98.1 F (36.7 C) (Oral)  Ht 6' (1.829 m)  Wt 180 lb 12.8 oz (82.01 kg)  BMI 24.52 kg/m2  SpO2 98% Wt Readings from Last 3 Encounters:  06/26/11 180 lb 12.8 oz (82.01 kg)  03/26/11 192 lb 6.4 oz (87.272 kg)  10/03/10 191 lb 12.8 oz (87 kg)    Constitutional:  He appears well-developed and well-nourished. No distress.  HENT: NCAT, no sinus tenderness - OP clear - teeth in fair repair without obvious caries Eyes: PERRL, EOMI - no conjunctivitis Neck: Normal range of motion. Neck supple. No JVD present. No thyromegaly present.  Cardiovascular: Normal rate, regular rhythm and normal heart sounds.  No murmur heard. no BLE edema Pulmonary/Chest: Effort normal and breath sounds normal. No respiratory distress. no wheezes.  Abdominal: Soft. Bowel sounds are normal. Patient exhibits no distension. There is no tenderness.  Musculoskeletal: Normal range of motion. Patient exhibits no edema.  Neurological: he is alert and oriented to person, place, and time. No cranial nerve deficit. Coordination normal.  Skin: Multiple SKs - scalp and torso; tiny hemangiomas on torso. Skin is warm and dry.  No erythema or ulceration.  Psychiatric: he has a normal mood and affect. behavior is normal. Judgment and thought content normal.   Lab Results  Component Value Date   WBC 14.2* 06/20/2011   HGB 14.7 06/20/2011   HCT 42.3 06/20/2011   PLT 185 06/20/2011   GLUCOSE 111* 06/20/2011   CHOL 126 07/09/2010   TRIG 80.0 07/09/2010   HDL 38.50* 07/09/2010   LDLDIRECT 189.8 06/28/2009   LDLCALC 72 07/09/2010   ALT 32 07/09/2010  AST 23 07/09/2010   NA 137 06/20/2011   K 3.3* 06/20/2011   CL 103 06/20/2011   CREATININE 0.86 06/20/2011   BUN 14 06/20/2011   CO2 24 06/20/2011   TSH 1.25 07/09/2010   PSA 0.76 07/09/2010   HGBA1C  Value: 6.0 (NOTE)                                                                    02/26/2010   EKG: sinus brady @ 59 bpm, no ischemic changes     Assessment & Plan:   CPX/v70.0 - Patient has been counseled on age-appropriate routine health concerns for screening and prevention. These are reviewed and up-to-date. Immunizations are up-to-date or declined. Labs ordered and ECG reviewed.  L 3rd toenail fungal infection - PenLac  rx'd  SKs/hemangiomas, change in mole - reassurance provided based on skin exam-  will refer to derm for opinion and possible removal  Chills/leukocytosis - likely related to improving dental infection - check labs with CPX as above and ESR - continue Amox  Hx glucose intolerance - expect improved with intentional weight loss thru diet changes -  check a1c now to eval same

## 2011-07-24 ENCOUNTER — Telehealth: Payer: Self-pay | Admitting: Nurse Practitioner

## 2011-07-24 NOTE — Telephone Encounter (Signed)
Pt called this evening stating that over the past few days he has been having intermittent back discomfort "between [his] shoulder blades."  It comes on with rest and is w/o associated Ss, is not reproducible by palpation, position changes, deep breathing, or coughing. It is at least partially alleviated by tylenol.  When it comes on, it lasts hours @ a time.  He does have a h/o CAD and his previous anginal equivalent was right sided chest pain.  He is asking if he needs to be worried.  I advised that there is no way to know for sure, based on an evaluation over the phone, if his current Ss were cardiac related.  If he's feeling poorly, I rec that he present to his local ER tonight for an ECG and CE.  If Ss recur, he plans to come into ED.

## 2011-07-25 ENCOUNTER — Ambulatory Visit (INDEPENDENT_AMBULATORY_CARE_PROVIDER_SITE_OTHER): Payer: 59 | Admitting: Endocrinology

## 2011-07-25 ENCOUNTER — Encounter: Payer: Self-pay | Admitting: Endocrinology

## 2011-07-25 VITALS — BP 142/88 | HR 68 | Temp 97.1°F | Ht 72.0 in | Wt 186.0 lb

## 2011-07-25 DIAGNOSIS — IMO0002 Reserved for concepts with insufficient information to code with codable children: Secondary | ICD-10-CM

## 2011-07-25 DIAGNOSIS — S39012A Strain of muscle, fascia and tendon of lower back, initial encounter: Secondary | ICD-10-CM

## 2011-07-25 MED ORDER — CYCLOBENZAPRINE HCL 10 MG PO TABS: 10.0000 mg | ORAL_TABLET | Freq: Three times a day (TID) | ORAL | Status: AC | PRN

## 2011-07-25 NOTE — Progress Notes (Signed)
Subjective:    Patient ID: Jonathan Mcintosh, male    DOB: 26-Jun-1960, 51 y.o.   MRN: [id number]  HPI Pt states few days of crampy-quality pain at the mid-back.  This started in the context of golf, long car trip, and housework.  There is slight assoc numbness of the left scapular area.  Pain unaffected by movement of the torso or UE's.   Past Medical History  Diagnosis Date  . ECZEMA   . PSORIASIS   . GLUCOSE INTOLERANCE 01/2010    a1c 6.0  . CORONARY ATHEROSCLEROSIS NATIVE CORONARY ARTERY 01/2010    DES x 2 RCA  . SINUS BRADYCARDIA   . ANXIETY   . DEPRESSION   . GERD   . HYPERLIPIDEMIA   . 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 Mass excision (l) thigh 08/2009    AVM    History   Social History  . Marital Status: Married    Spouse Name: N/A    Number of Children: N/A  . Years of Education: N/A   Occupational History  . Not on file.   Social History Main Topics  . Smoking status: Never Smoker   . Smokeless tobacco: Never Used  . Alcohol Use: No  . Drug Use: No  . Sexually Active: Not on file   Other Topics Concern  . Not on file   Social History Narrative  . No narrative on file    Current Outpatient Prescriptions on File Prior to Visit  Medication Sig Dispense Refill  . amoxicillin (AMOXIL) 500 MG capsule Take 1 by mouth three times a day      . aspirin 81 MG tablet Take 81 mg by mouth daily.        . ciclopirox (PENLAC) 8 % solution Apply topically at bedtime. Apply over nail and surrounding skin. Apply daily over previous coat. After seven (7) days, may remove with alcohol and continue cycle.  6.6 mL  0  . CRESTOR 40 MG tablet TAKE 1 TABLET BY MOUTH EVERY DAY  30 tablet  5  . metoprolol tartrate (LOPRESSOR) 25 MG tablet Take 0.5 tablets (12.5 mg total) by mouth 2 (two) times  daily.  90 tablet  3  . Multiple Vitamin (MULITIVITAMIN WITH MINERALS) TABS Take 1 tablet by mouth daily.      . nitroGLYCERIN (NITROSTAT) 0.4 MG SL tablet Place 0.4 mg under the tongue every 5 (five) minutes as needed. Up to 3 doses       . Omega-3 Fatty Acids (FISH OIL) 1000 MG CAPS Take by mouth daily.        Marland Kitchen omeprazole (PRILOSEC OTC) 20 MG tablet Take 20 mg by mouth daily.         No Known Allergies  Family History  Problem Relation Age of Onset  . Heart disease Sister   . Cancer Brother     Prostate  . Hepatitis Other   . Seizures Brother     Isolated    BP 142/88  Pulse 68  Temp(Src) 97.1 F (36.2 C) (Oral)  Ht 6' (1.829 m)  Wt 186 lb (84.369 kg)  BMI 25.23 kg/m2  SpO2 96%   Review of Systems Denies bowel or bladder retention.      Objective:  Physical Exam VITAL SIGNS:  See vs page. GENERAL: no distress. LUNGS:  Clear to auscultation Spine: nontender.   Gait: normal and steady.        Assessment & Plan:  Back strain, new

## 2011-07-25 NOTE — Patient Instructions (Addendum)
Are samples of "celebrex," (1 pill daily), and "flector," (1 patch, twice a day).   i have sent a prescription to your pharmacy, for "flexeril."   I hope you feel better soon.  If you don't feel better by next week, please call back.  Please call sooner if you get worse.

## 2011-07-29 ENCOUNTER — Ambulatory Visit (INDEPENDENT_AMBULATORY_CARE_PROVIDER_SITE_OTHER): Payer: 59 | Admitting: Cardiology

## 2011-07-29 ENCOUNTER — Encounter: Payer: Self-pay | Admitting: Cardiology

## 2011-07-29 VITALS — BP 120/73 | HR 84 | Resp 17 | Ht 72.0 in | Wt 180.8 lb

## 2011-07-29 DIAGNOSIS — E785 Hyperlipidemia, unspecified: Secondary | ICD-10-CM

## 2011-07-29 DIAGNOSIS — F329 Major depressive disorder, single episode, unspecified: Secondary | ICD-10-CM

## 2011-07-29 DIAGNOSIS — I2510 Atherosclerotic heart disease of native coronary artery without angina pectoris: Secondary | ICD-10-CM

## 2011-07-29 NOTE — Assessment & Plan Note (Signed)
Doubt any symptoms are ischemic in nature.  Will continue to monitor.

## 2011-07-29 NOTE — Assessment & Plan Note (Signed)
He does not think this is active now.

## 2011-07-29 NOTE — Assessment & Plan Note (Signed)
Near target.  

## 2011-07-29 NOTE — Progress Notes (Signed)
HPI:  He is doing pretty well.  He has had what he describes as possible anxiety or panic attacks.  He had some dental work done, and was on antibiotics.  About three days later, he had a brief spell of shaking without fever.  He has had a couple since.  He had one brief episode of sharp chest pain.  This was not prolonged, nor was it associated with other symptoms.  He has one period in the past where he had had some short period of despondency.   This is somewhat similar, but it did end last week.  He is otherwise stable.  He denies any exertional chest pain.    Current Outpatient Prescriptions  Medication Sig Dispense Refill  . aspirin 81 MG tablet Take 81 mg by mouth daily.        . ciclopirox (PENLAC) 8 % solution Apply topically at bedtime. Apply over nail and surrounding skin. Apply daily over previous coat. After seven (7) days, may remove with alcohol and continue cycle.  6.6 mL  0  . CRESTOR 40 MG tablet TAKE 1 TABLET BY MOUTH EVERY DAY  30 tablet  5  . cyclobenzaprine (FLEXERIL) 10 MG tablet Take 1 tablet (10 mg total) by mouth 3 (three) times daily as needed for muscle spasms.  30 tablet  2  . metoprolol tartrate (LOPRESSOR) 25 MG tablet Take 0.5 tablets (12.5 mg total) by mouth 2 (two) times daily.  90 tablet  3  . Multiple Vitamin (MULITIVITAMIN WITH MINERALS) TABS Take 1 tablet by mouth daily.      . nitroGLYCERIN (NITROSTAT) 0.4 MG SL tablet Place 0.4 mg under the tongue every 5 (five) minutes as needed. Up to 3 doses       . Omega-3 Fatty Acids (FISH OIL) 1000 MG CAPS Take by mouth daily.        Marland Kitchen omeprazole (PRILOSEC OTC) 20 MG tablet Take 20 mg by mouth daily.       Marland Kitchen amoxicillin (AMOXIL) 500 MG capsule Take 1 by mouth three times a day        No Known Allergies  Past Medical History  Diagnosis Date  . ECZEMA   . PSORIASIS   . GLUCOSE INTOLERANCE 01/2010    a1c 6.0  . CORONARY ATHEROSCLEROSIS NATIVE CORONARY ARTERY 01/2010    DES x 2 RCA  . SINUS BRADYCARDIA   .  ANXIETY   . DEPRESSION   . GERD   . HYPERLIPIDEMIA   . 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 Mass excision (l) thigh 08/2009    AVM    Family History  Problem Relation Age of Onset  . Heart disease Sister   . Cancer Brother     Prostate  . Hepatitis Other   . Seizures Brother     Isolated    History   Social History  . Marital Status: Married    Spouse Name: N/A    Number of Children: N/A  . Years of Education: N/A   Occupational History  . Not on file.   Social History Main Topics  . Smoking status: Never Smoker   . Smokeless tobacco: Never Used  . Alcohol Use: No  . Drug Use: No  . Sexually  Active: Not on file   Other Topics Concern  . Not on file   Social History Narrative  . No narrative on file    ROS: Please see the HPI.  All other systems reviewed and negative.  PHYSICAL EXAM:  BP 120/73  Pulse 84  Resp 17  Ht 6' (1.829 m)  Wt 180 lb 12.8 oz (82.01 kg)  BMI 24.52 kg/m2  General: Well developed, well nourished, in no acute distress. Head:  Normocephalic and atraumatic. Neck: no JVD Lungs: Clear to auscultation and percussion. Heart: Normal S1 and S2.  No murmur, rubs or gallops.  Abdomen:  Normal bowel sounds; soft; non tender; no organomegaly Pulses: Pulses normal in all 4 extremities. Extremities: No clubbing or cyanosis. No edema. Neurologic: Alert and oriented x 3.  EKG:  NSR.  Minor nonspecific T abnormality.    ASSESSMENT AND PLAN:

## 2011-07-29 NOTE — Patient Instructions (Addendum)
Your physician wants you to follow-up in: 4 MONTHS with Dr Stuckey.  You will receive a reminder letter in the mail two months in advance. If you don't receive a letter, please call our office to schedule the follow-up appointment.  Your physician recommends that you continue on your current medications as directed. Please refer to the Current Medication list given to you today.  

## 2011-08-06 ENCOUNTER — Telehealth: Payer: Self-pay | Admitting: Cardiology

## 2011-08-06 NOTE — Telephone Encounter (Signed)
I spoke with the pt and he has been having muscle spasms in his back and pain in his legs and arms which he forgot to mention at his recent office visit.  The pt said the muscle spasms have resolved but his extremity pain continues to come and go.  The pt is wondering if these symptoms are related to his Crestor. I instructed the pt to hold Crestor for 10-14 days to see if symptoms improve.  The pt will call our office to let us know how he is feeling off of Crestor.  Pt agreed with plan.   The pt also complains of dizziness related to movement. The pt describes this as a spinning sensation.  I made the pt aware that this sounds like vertigo.  The pt does have issues with motion sickness related to his job.  The pt has been taking OTC medications for motion sickness.  At this time the pt will continue OTC meds.

## 2011-08-06 NOTE — Telephone Encounter (Signed)
New problem:  Patient calling no information was given, will explain when nurse Lauren calls.

## 2011-08-21 ENCOUNTER — Ambulatory Visit (INDEPENDENT_AMBULATORY_CARE_PROVIDER_SITE_OTHER): Payer: 59 | Admitting: Internal Medicine

## 2011-08-21 ENCOUNTER — Encounter: Payer: Self-pay | Admitting: Internal Medicine

## 2011-08-21 ENCOUNTER — Other Ambulatory Visit (INDEPENDENT_AMBULATORY_CARE_PROVIDER_SITE_OTHER): Payer: 59

## 2011-08-21 VITALS — BP 132/70 | HR 67 | Temp 97.6°F | Ht 72.0 in | Wt 182.0 lb

## 2011-08-21 DIAGNOSIS — R221 Localized swelling, mass and lump, neck: Secondary | ICD-10-CM

## 2011-08-21 DIAGNOSIS — Z1211 Encounter for screening for malignant neoplasm of colon: Secondary | ICD-10-CM

## 2011-08-21 DIAGNOSIS — R5383 Other fatigue: Secondary | ICD-10-CM

## 2011-08-21 DIAGNOSIS — R5381 Other malaise: Secondary | ICD-10-CM

## 2011-08-21 LAB — CBC WITH DIFFERENTIAL/PLATELET
Basophils Absolute: 0 10*3/uL (ref 0.0–0.1)
Basophils Relative: 0.6 % (ref 0.0–3.0)
Eosinophils Absolute: 0.1 10*3/uL (ref 0.0–0.7)
Hemoglobin: 14.9 g/dL (ref 13.0–17.0)
Lymphs Abs: 3.5 10*3/uL (ref 0.7–4.0)
MCHC: 33.4 g/dL (ref 30.0–36.0)
MCV: 82.6 fl (ref 78.0–100.0)
Monocytes Absolute: 0.4 10*3/uL (ref 0.1–1.0)
Neutro Abs: 4.1 10*3/uL (ref 1.4–7.7)
RBC: 5.39 Mil/uL (ref 4.22–5.81)
RDW: 13.1 % (ref 11.5–14.6)

## 2011-08-21 NOTE — Progress Notes (Signed)
  Subjective:    Patient ID: Jonathan Mcintosh, male    DOB: [birth year], 51 y.o.   MRN: [id number]  HPI complains of swollen throat/neck sensation Onset 3 weeks ago No change in neck collar but "heavy" anterior neck +PND and sore throat related to allergic rhinitis  No dysphagia, choking, ondynophagia or GERD Overall fatigue persists - ongoing >3 mo  Past Medical History  Diagnosis Date  . ECZEMA   . PSORIASIS   . GLUCOSE INTOLERANCE 01/2010    a1c 6.0  . CORONARY ATHEROSCLEROSIS NATIVE CORONARY ARTERY 01/2010    DES x 2 RCA  . SINUS BRADYCARDIA   . ANXIETY   . DEPRESSION   . GERD   . HYPERLIPIDEMIA   . ACUT MYOCARD INFARCT OTH INF WALL EPIS CARE UNS 02/26/2010    DES x 2 RCA    Review of Systems  Constitutional: Positive for fatigue. Negative for fever and unexpected weight change.  HENT: Negative for ear pain, facial swelling, rhinorrhea, mouth sores and sinus pressure.   Respiratory: Negative for shortness of breath and wheezing.   Cardiovascular: Negative for chest pain and palpitations.       Objective:   Physical Exam BP 132/70  Pulse 67  Temp 97.6 F (36.4 C) (Oral)  Ht 6' (1.829 m)  Wt 182 lb (82.555 kg)  BMI 24.68 kg/m2  SpO2 97% Wt Readings from Last 3 Encounters:  08/21/11 182 lb (82.555 kg)  07/29/11 180 lb 12.8 oz (82.01 kg)  07/25/11 186 lb (84.369 kg)   Constitutional:  He appears well-developed and well-nourished. No distress.  HENT: NCAT, TMs clear B, OP clear Neck: Normal range of motion. Neck supple. No JVD present. No thyromegaly or appreciable mass present.  Cardiovascular: Normal rate, regular rhythm and normal heart sounds.  No murmur heard. no BLE edema Pulmonary/Chest: Effort normal and breath sounds normal. No respiratory distress. no wheezes Skin: Skin is warm and dry.  No erythema or ulceration.  Psychiatric: he has a normal mood and affect. behavior is normal. Judgment and thought content normal.   Lab Results  Component Value  Date   WBC 7.9 06/26/2011   HGB 15.6 06/26/2011   HCT 46.3 06/26/2011   PLT 191.0 06/26/2011   GLUCOSE 97 06/26/2011   CHOL 136 06/26/2011   TRIG 63.0 06/26/2011   HDL 46.40 06/26/2011   LDLDIRECT 189.8 06/28/2009   LDLCALC 77 06/26/2011   ALT 26 06/26/2011   AST 19 06/26/2011   NA 141 06/26/2011   K 4.2 06/26/2011   CL 106 06/26/2011   CREATININE 0.9 06/26/2011   BUN 17 06/26/2011   CO2 27 06/26/2011   TSH 1.20 06/26/2011   PSA 0.78 06/26/2011   HGBA1C 5.9 06/26/2011       Assessment & Plan:   Anterior neck sensation of fullness - exam unremarkable but will check [place]  Fatigue - nonspecific -check labs including EBV

## 2011-08-21 NOTE — Patient Instructions (Signed)
It was good to see you today. Test(s) ordered today. Your results will be called to you after review (48-72hours after test completion). If any changes need to be made, you will be notified at that time. we'll make referral for neck ultrasound . Our office will contact you regarding appointment(s) once made. Will check on refer to Dr Christella Hartigan

## 2011-08-22 ENCOUNTER — Ambulatory Visit
Admission: RE | Admit: 2011-08-22 | Discharge: 2011-08-22 | Disposition: A | Discharge status: Home / Self Care | Payer: 59 | Source: Ambulatory Visit | Attending: Internal Medicine | Admitting: Internal Medicine

## 2011-08-22 DIAGNOSIS — R5383 Other fatigue: Secondary | ICD-10-CM

## 2011-08-22 DIAGNOSIS — R221 Localized swelling, mass and lump, neck: Secondary | ICD-10-CM

## 2011-08-22 LAB — EPSTEIN-BARR VIRUS VCA ANTIBODY PANEL: EBV VCA IgG: 23.5 U/mL — ABNORMAL HIGH (ref <18.0)

## 2011-08-25 ENCOUNTER — Encounter: Payer: Self-pay | Admitting: Internal Medicine

## 2011-08-25 DIAGNOSIS — E041 Nontoxic single thyroid nodule: Secondary | ICD-10-CM | POA: Insufficient documentation

## 2011-10-29 ENCOUNTER — Ambulatory Visit (AMBULATORY_SURGERY_CENTER): Payer: 59

## 2011-10-29 VITALS — Ht 72.0 in | Wt 187.0 lb

## 2011-10-29 DIAGNOSIS — R1032 Left lower quadrant pain: Secondary | ICD-10-CM

## 2011-10-29 DIAGNOSIS — Z1211 Encounter for screening for malignant neoplasm of colon: Secondary | ICD-10-CM

## 2011-10-29 MED ORDER — MOVIPREP 100 G PO SOLR: ORAL | Status: DC

## 2011-10-30 ENCOUNTER — Encounter: Payer: Self-pay | Admitting: Gastroenterology

## 2011-10-31 ENCOUNTER — Ambulatory Visit (INDEPENDENT_AMBULATORY_CARE_PROVIDER_SITE_OTHER): Payer: 59 | Admitting: Internal Medicine

## 2011-10-31 ENCOUNTER — Other Ambulatory Visit (INDEPENDENT_AMBULATORY_CARE_PROVIDER_SITE_OTHER): Payer: 59

## 2011-10-31 ENCOUNTER — Encounter: Payer: Self-pay | Admitting: Internal Medicine

## 2011-10-31 VITALS — BP 112/78 | HR 56 | Temp 98.3°F | Ht 72.0 in | Wt 178.8 lb

## 2011-10-31 DIAGNOSIS — R1032 Left lower quadrant pain: Secondary | ICD-10-CM

## 2011-10-31 DIAGNOSIS — R1013 Epigastric pain: Secondary | ICD-10-CM

## 2011-10-31 DIAGNOSIS — R1011 Right upper quadrant pain: Secondary | ICD-10-CM

## 2011-10-31 DIAGNOSIS — K5732 Diverticulitis of large intestine without perforation or abscess without bleeding: Secondary | ICD-10-CM

## 2011-10-31 LAB — URINALYSIS, ROUTINE W REFLEX MICROSCOPIC
Bilirubin Urine: NEGATIVE
Hgb urine dipstick: NEGATIVE
Ketones, ur: NEGATIVE
Specific Gravity, Urine: 1.02 (ref 1.000–1.030)
Urine Glucose: NEGATIVE
Urobilinogen, UA: 0.2 (ref 0.0–1.0)

## 2011-10-31 LAB — CBC WITH DIFFERENTIAL/PLATELET
Basophils Relative: 0.7 % (ref 0.0–3.0)
Eosinophils Absolute: 0.1 10*3/uL (ref 0.0–0.7)
HCT: 47.2 % (ref 39.0–52.0)
Hemoglobin: 15.4 g/dL (ref 13.0–17.0)
Lymphs Abs: 3.8 10*3/uL (ref 0.7–4.0)
MCHC: 32.7 g/dL (ref 30.0–36.0)
MCV: 83.3 fl (ref 78.0–100.0)
Monocytes Absolute: 0.6 10*3/uL (ref 0.1–1.0)
Neutro Abs: 4.8 10*3/uL (ref 1.4–7.7)
RBC: 5.67 Mil/uL (ref 4.22–5.81)

## 2011-10-31 LAB — BASIC METABOLIC PANEL
CO2: 28 mEq/L (ref 19–32)
Calcium: 9.5 mg/dL (ref 8.4–10.5)
Glucose, Bld: 90 mg/dL (ref 70–99)
Potassium: 3.6 mEq/L (ref 3.5–5.1)
Sodium: 138 mEq/L (ref 135–145)

## 2011-10-31 LAB — HEPATIC FUNCTION PANEL
AST: 20 U/L (ref 0–37)
Total Bilirubin: 0.7 mg/dL (ref 0.3–1.2)

## 2011-10-31 MED ORDER — OMEPRAZOLE MAGNESIUM 20 MG PO TBEC: 40.0000 mg | DELAYED_RELEASE_TABLET | Freq: Every day | ORAL | Status: AC

## 2011-10-31 NOTE — Progress Notes (Signed)
Subjective:    Patient ID: Jonathan Mcintosh, male    DOB: 1960/12/11, 51 y.o.   MRN: [id number]  Abdominal Pain This is a new problem. Episode onset: 10 days ago. The onset quality is sudden. The problem occurs intermittently. The problem has been waxing and waning. The pain is located in the LLQ and epigastric region. The pain is mild. The quality of the pain is aching, burning and cramping. The abdominal pain does not radiate. Associated symptoms include anorexia. Pertinent negatives include no constipation, diarrhea, dysuria, fever, flatus, hematochezia, hematuria, nausea or vomiting. Weight loss: intentional, but alos no appetitie. Nothing aggravates the pain. The pain is relieved by eating. He has tried proton pump inhibitors for the symptoms. The treatment provided no relief. His past medical history is significant for GERD. There is no history of abdominal surgery, colon cancer, gallstones, irritable bowel syndrome, pancreatitis or PUD.    Past Medical History  Diagnosis Date  . ECZEMA   . PSORIASIS   . GLUCOSE INTOLERANCE 01/2010    a1c 6.0  . CORONARY ATHEROSCLEROSIS NATIVE CORONARY ARTERY 01/2010    DES x 2 RCA  . SINUS BRADYCARDIA   . ANXIETY   . DEPRESSION   . GERD   . HYPERLIPIDEMIA   . 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 Abdominal pain, chronic, left lower quadrant      Review of Systems  Constitutional: Negative for fever. Weight loss: intentional, but alos no appetitie.  Gastrointestinal: Positive for abdominal pain and anorexia. Negative for nausea, vomiting, diarrhea, constipation, hematochezia and flatus.  Genitourinary: Negative for dysuria and hematuria.       Objective:   Physical Exam BP 112/78  Pulse 56  Temp 98.3 F (36.8 C) (Oral)  Ht 6' (1.829 m)  Wt 178 lb 12.8 oz (81.103 kg)  BMI 24.25 kg/m2  SpO2 97% Wt Readings from Last 3 Encounters:  10/31/11 178 lb 12.8 oz (81.103 kg)  10/29/11 187 lb (84.823 kg)   08/21/11 182 lb (82.555 kg)   Constitutional:  He appears well-developed and well-nourished. No distress.  Neck: Normal range of motion. Neck supple. No JVD present. No thyromegaly present.  Cardiovascular: Normal rate, regular rhythm and normal heart sounds.  No murmur heard. no BLE edema Pulmonary/Chest: Effort normal and breath sounds normal. No respiratory distress. no wheezes.  Abdominal: Mildly tender over RUQ. Soft. Bowel sounds are normal. Patient exhibits no distension. There is no other tenderness or rebound. Skin: Skin is warm and dry.  No erythema or ulceration.  Psychiatric: he has a normal mood and affect. behavior is normal. Judgment and thought content normal.   Lab Results  Component Value Date   WBC 8.2 08/21/2011   HGB 14.9 08/21/2011   HCT 44.5 08/21/2011   PLT 187.0 08/21/2011   GLUCOSE 97 06/26/2011   CHOL 136 06/26/2011   TRIG 63.0 06/26/2011   HDL 46.40 06/26/2011   LDLDIRECT 189.8 06/28/2009   LDLCALC 77 06/26/2011   ALT 26 06/26/2011   AST 19 06/26/2011   NA 141 06/26/2011   K 4.2 06/26/2011   CL 106 06/26/2011   CREATININE 0.9 06/26/2011   BUN 17 06/26/2011   CO2 27 06/26/2011   TSH 1.26 08/21/2011   PSA 0.78 06/26/2011   HGBA1C 5.9 06/26/2011        Assessment & Plan:  LLQ, RUQ and epigastric discomfort x 10 days - ?bilary dyskinesia, diverticular flare vs other  Check labs and UA Refer for CT Increase PPI to bid pending eval

## 2011-10-31 NOTE — Patient Instructions (Signed)
It was good to see you today. Test(s) ordered today. Your results will be called to you after review (48-72hours after test completion). If any changes need to be made, you will be notified at that time. Refer for CT scan of abdomen/pelvis - Test(s) ordered today. Your results will be called to you after review (48-72hours after test completion). If any changes need to be made, you will be notified at that time. Increase prilosec to 40mg  daily until evaluation complete (2-20mg /day)

## 2011-11-06 ENCOUNTER — Ambulatory Visit (INDEPENDENT_AMBULATORY_CARE_PROVIDER_SITE_OTHER)
Admission: RE | Admit: 2011-11-06 | Discharge: 2011-11-06 | Disposition: A | Discharge status: Home / Self Care | Payer: 59 | Source: Ambulatory Visit | Attending: Internal Medicine | Admitting: Internal Medicine

## 2011-11-06 DIAGNOSIS — K5732 Diverticulitis of large intestine without perforation or abscess without bleeding: Secondary | ICD-10-CM | POA: Insufficient documentation

## 2011-11-06 DIAGNOSIS — R1032 Left lower quadrant pain: Secondary | ICD-10-CM

## 2011-11-06 DIAGNOSIS — R1013 Epigastric pain: Secondary | ICD-10-CM

## 2011-11-06 DIAGNOSIS — R1011 Right upper quadrant pain: Secondary | ICD-10-CM

## 2011-11-06 MED ORDER — IOHEXOL 300 MG/ML  SOLN
100.0000 mL | Freq: Once | INTRAMUSCULAR | Status: AC | PRN
  Administered 2011-11-06: 100 mL via INTRAVENOUS

## 2011-11-06 MED ORDER — METRONIDAZOLE 500 MG PO TABS: 500.0000 mg | ORAL_TABLET | Freq: Three times a day (TID) | ORAL | Status: AC

## 2011-11-06 MED ORDER — CIPROFLOXACIN HCL 500 MG PO TABS: 500.0000 mg | ORAL_TABLET | Freq: Two times a day (BID) | ORAL | Status: AC

## 2011-11-06 NOTE — Addendum Note (Signed)
Addended by: Rene Paci A on: 11/06/2011 01:18 PM   Modules accepted: Orders

## 2011-11-12 ENCOUNTER — Encounter: Payer: Self-pay | Admitting: Gastroenterology

## 2011-11-12 ENCOUNTER — Ambulatory Visit (AMBULATORY_SURGERY_CENTER): Payer: 59 | Admitting: Gastroenterology

## 2011-11-12 VITALS — BP 141/80 | HR 84 | Temp 98.0°F | Resp 30 | Ht 72.0 in | Wt 187.0 lb

## 2011-11-12 DIAGNOSIS — K5730 Diverticulosis of large intestine without perforation or abscess without bleeding: Secondary | ICD-10-CM

## 2011-11-12 DIAGNOSIS — Z1211 Encounter for screening for malignant neoplasm of colon: Secondary | ICD-10-CM

## 2011-11-12 DIAGNOSIS — D126 Benign neoplasm of colon, unspecified: Secondary | ICD-10-CM

## 2011-11-12 HISTORY — PX: POLYPECTOMY: SHX149

## 2011-11-12 HISTORY — PX: COLONOSCOPY: SHX174

## 2011-11-12 MED ORDER — SODIUM CHLORIDE 0.9 % IV SOLN: 500.0000 mL | INTRAVENOUS | Status: DC

## 2011-11-12 NOTE — Progress Notes (Signed)
Pt did have abd cramping with the scope advancement, meds titrated per Dr. Christella Hartigan orders.  Once the cecum was reached the pt relaxed and rested comfortablly. Maw

## 2011-11-12 NOTE — Patient Instructions (Addendum)

## 2011-11-12 NOTE — Op Note (Signed)
[facility] [address] [place], [address]   COLONOSCOPY PROCEDURE REPORT  PATIENT: Jonathan Mcintosh, Jonathan Mcintosh  MR#: [id number] BIRTHDATE: 12-17-1960 , 51  yrs. old GENDER: Male ENDOSCOPIST: Rachael Fee, MD REFERRED [id number] Felicity Coyer, M.D. PROCEDURE DATE:  11/12/2011 PROCEDURE:   Colonoscopy with snare polypectomy ASA CLASS:   Class II INDICATIONS: average risk screening, had "mild acute descending diverticulitis" on CT 12 days ago, treated with Abx, pain gone, did not have elevated.  We discussed that this would slightly increased risk for procedural complications but I felt given lack of pain and 12 day interval that it was safe to proceed. MEDICATIONS: Fentanyl 75 mcg IV, Versed 8 mg IV, and These medications were titrated to patient response per physician's verbal order  DESCRIPTION OF PROCEDURE:   After the risks benefits and alternatives of the procedure were thoroughly explained, informed consent was obtained.  A digital rectal exam revealed no rectal mass.   The LB [id number]  endoscope was introduced through the anus and advanced to the cecum, which was identified by both the appendix and ileocecal valve. No adverse events experienced. The quality of the prep was good.  The instrument was then slowly withdrawn as the colon was fully examined.   COLON FINDINGS: Three sessile polyps were found, all were removed and all sent to pathology (jar 1).  These ranged in size from 4mm to 8mm, located in descending and sigmoid segments.  There was mild diverticulosis, no signs of active inflammation or infection.  The examination was otherwise normal.  Retroflexed views revealed no abnormalities. The time to cecum=3 minutes 57 seconds.  Withdrawal time=9 minutes 03 seconds.  The scope was withdrawn and the procedure completed. COMPLICATIONS: There were no complications.  ENDOSCOPIC IMPRESSION: Three small polyps, all were removed and all sent to  pathology Mild diverticulosis without sign of active infection.  RECOMMENDATIONS: If the polyp(s) removed today are proven to be adenomatous (pre-cancerous) polyps, you will need a colonoscopy in 3 years. Otherwise you should continue to follow colorectal cancer screening guidelines for "routine risk" patients with a colonoscopy in 10 years.  You will receive a letter within 1-2 weeks with the results of your biopsy as well as final recommendations.  Please call my office if you have not received a letter after 3 weeks.    eSigned:  Rachael Fee, MD 11/12/2011 8:54 AM      PATIENT NAME:  Jonathan Mcintosh, Jonathan Mcintosh MR#: [id number]

## 2011-11-12 NOTE — Progress Notes (Signed)
Dr. Christella Hartigan into the procedure room and while speaking with the pt, the pt advised him he recently had a CT which showed diverticulitis.  Dr. Christella Hartigan told the pt he would like to look at the ct in the computer.  Per Dr. Christella Hartigan he felt we it would be okay to proceed with the colonoscopy since the ct was from the 30th. Maw

## 2011-11-12 NOTE — Progress Notes (Signed)
Patient did not experience any of the following events: a burn prior to discharge; a fall within the facility; wrong site/side/patient/procedure/implant event; or a hospital transfer or hospital admission upon discharge from the facility. (G8907) Patient did not have preoperative order for IV antibiotic SSI prophylaxis. (G8918)  

## 2011-11-13 ENCOUNTER — Telehealth: Payer: Self-pay | Admitting: *Deleted

## 2011-11-13 NOTE — Telephone Encounter (Signed)
  Follow up Call-  Call back number 11/12/2011  Post procedure Call Back phone  # [phone]  Permission to leave phone message Yes     Patient questions:  Do you have a fever, pain , or abdominal swelling? no Pain Score  0 *  Have you tolerated food without any problems? yes  Have you been able to return to your normal activities? yes  Do you have any questions about your discharge instructions: Diet   no Medications  no Follow up visit  no  Do you have questions or concerns about your Care? no  Actions: * If pain score is 4 or above: No action needed, pain <4.

## 2011-11-18 ENCOUNTER — Encounter: Payer: Self-pay | Admitting: Gastroenterology

## 2011-12-10 ENCOUNTER — Other Ambulatory Visit: Payer: Self-pay | Admitting: Cardiology

## 2011-12-10 NOTE — Telephone Encounter (Signed)
..   Requested Prescriptions   Pending Prescriptions Disp Refills  . CRESTOR 40 MG tablet [Pharmacy Med Name: CRESTOR 40MG  TABLETS] 30 tablet 1    Sig: TAKE 1 TABLET BY MOUTH EVERY DAY

## 2011-12-19 ENCOUNTER — Telehealth: Payer: Self-pay | Admitting: Cardiology

## 2011-12-19 NOTE — Telephone Encounter (Signed)
New Problem:    Patient called in needing clarification of the dosage for his BP medication.  Please call back.

## 2011-12-19 NOTE — Telephone Encounter (Signed)
Pt calls for clarification on metoprolol dosing. Taking 12.5mg  bid metoprolol Mylo Red RN

## 2012-02-12 ENCOUNTER — Other Ambulatory Visit: Payer: Self-pay | Admitting: *Deleted

## 2012-02-12 MED ORDER — ROSUVASTATIN CALCIUM 40 MG PO TABS: 40.0000 mg | ORAL_TABLET | Freq: Every day | ORAL | Status: DC

## 2012-02-14 IMAGING — CR DG CHEST 2V
2 series · 2 of 2 positions shown · non-contrast
Comparison: None.

CLINICAL DATA: Chest pain.

CHEST - 2 VIEW

[w chest pa]
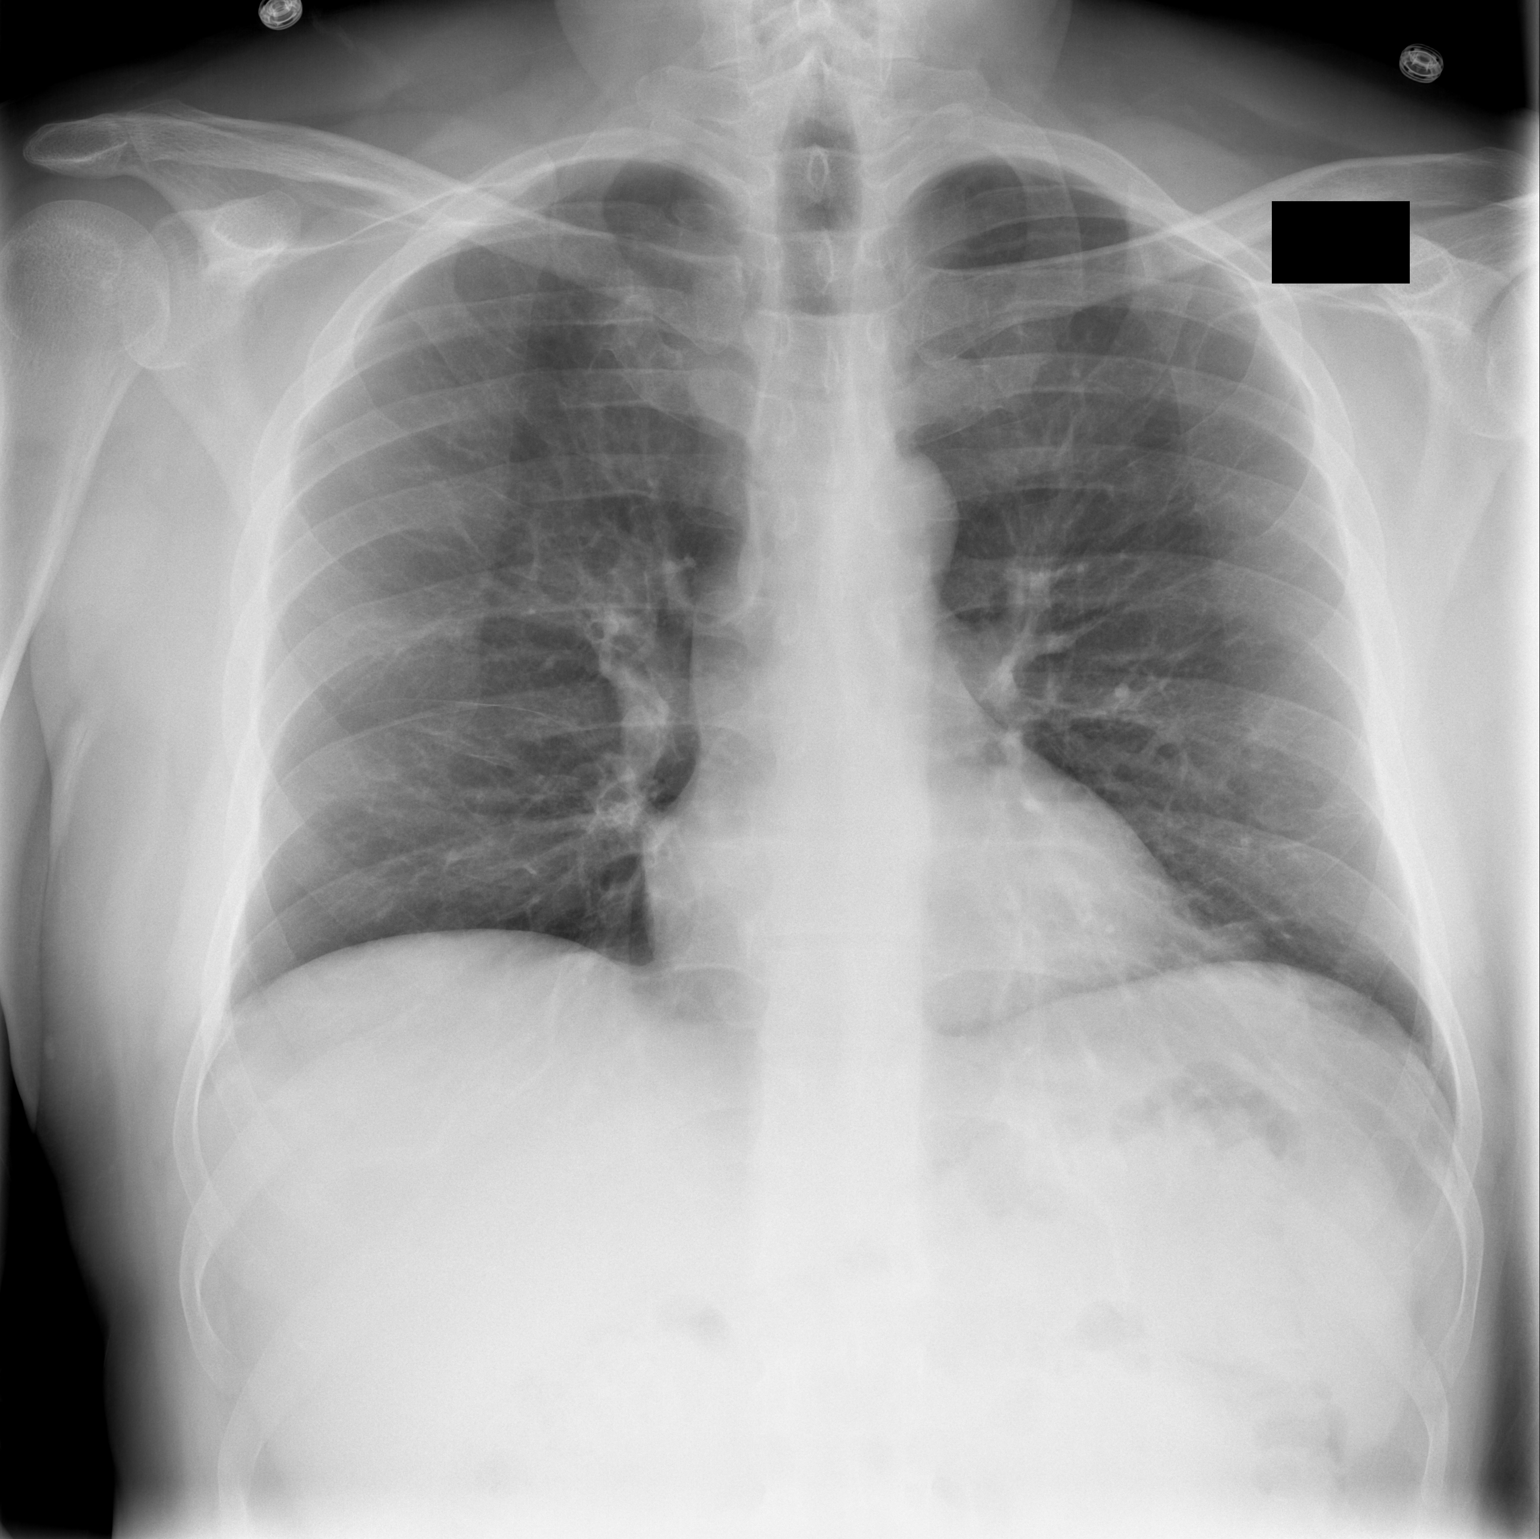

[w chest lat]
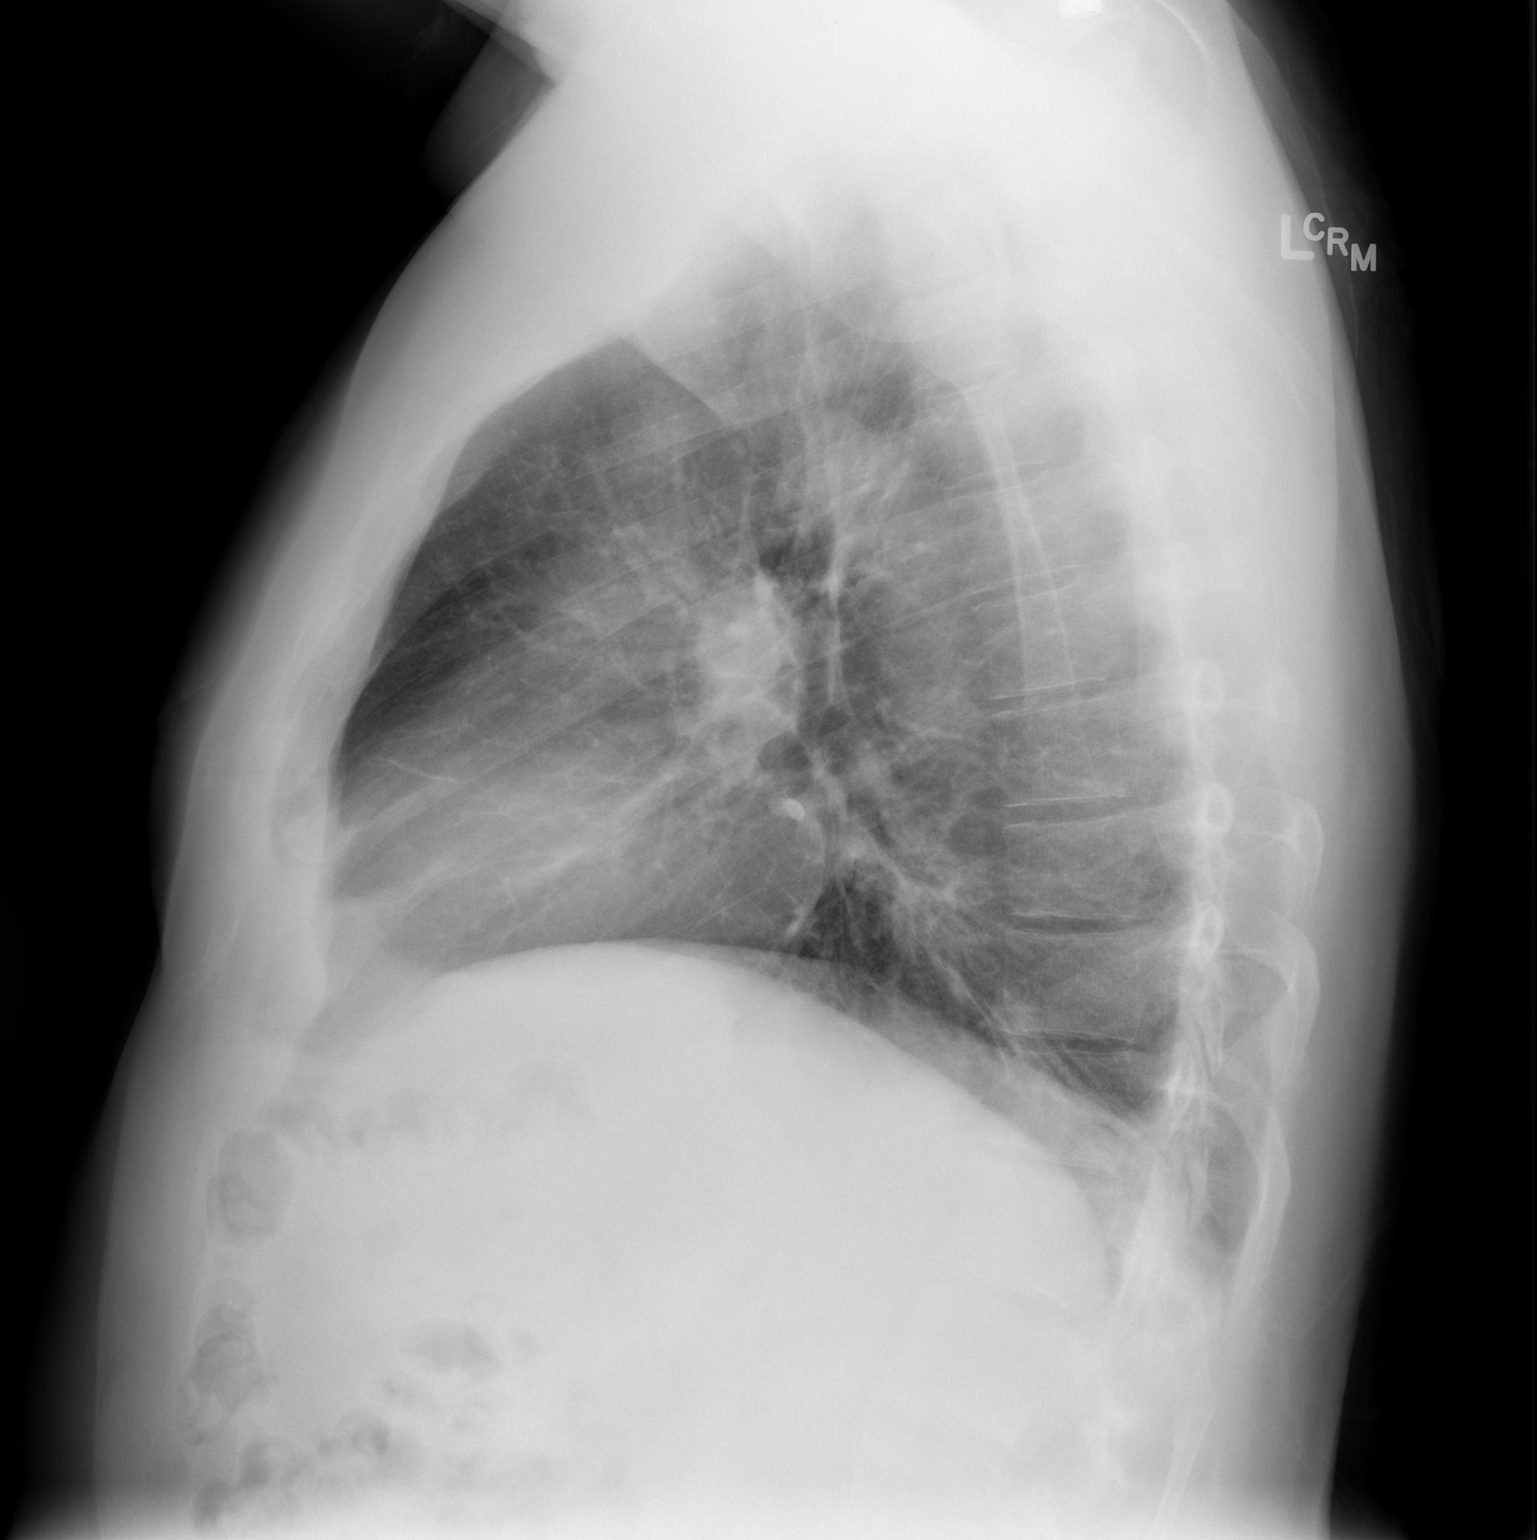

[2 of 2 positions shown; findings below may reference images not displayed]

FINDINGS: Lungs are clear.  Heart size is normal.  No pneumothorax
or pleural effusion.  No focal bony abnormality.
IMPRESSION: No acute disease.

## 2012-03-16 ENCOUNTER — Other Ambulatory Visit: Payer: Self-pay

## 2012-03-16 MED ORDER — NITROGLYCERIN 0.4 MG SL SUBL: 0.4000 mg | SUBLINGUAL_TABLET | SUBLINGUAL | Status: DC | PRN

## 2012-04-08 ENCOUNTER — Other Ambulatory Visit: Payer: Self-pay | Admitting: *Deleted

## 2012-04-08 MED ORDER — ROSUVASTATIN CALCIUM 40 MG PO TABS: 40.0000 mg | ORAL_TABLET | Freq: Every day | ORAL | Status: DC

## 2012-04-13 ENCOUNTER — Encounter: Payer: Self-pay | Admitting: Cardiology

## 2012-04-13 ENCOUNTER — Ambulatory Visit (INDEPENDENT_AMBULATORY_CARE_PROVIDER_SITE_OTHER): Payer: 59 | Admitting: Cardiology

## 2012-04-13 VITALS — BP 141/83 | HR 63 | Ht 72.0 in | Wt 190.0 lb

## 2012-04-13 DIAGNOSIS — E785 Hyperlipidemia, unspecified: Secondary | ICD-10-CM

## 2012-04-13 DIAGNOSIS — I2510 Atherosclerotic heart disease of native coronary artery without angina pectoris: Secondary | ICD-10-CM

## 2012-04-13 NOTE — Assessment & Plan Note (Signed)
Patient remains perfectly stable. 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 He is getting ready to leave the [place] for a mission trip. As a result, I think we will consider going ahead and doing an exercise stress test on him to make sure he does not have active ongoing ischemia.

## 2012-04-13 NOTE — Assessment & Plan Note (Signed)
Patient has hyperlipidemia, and his lipids were dramatically improved on Crestor. We will continue this at the present time and get a lipid liver profile

## 2012-04-13 NOTE — Progress Notes (Signed)
HPI:      The patient is in for followup. He generally is doing extremely well. He denies any ongoing chest pain. He will rarely have some mild discomfort for which he takes nitroglycerin. None of his exertional. He works out 3-4 times a week without any difficulty   Current Outpatient Prescriptions  Medication Sig Dispense Refill  . aspirin 81 MG tablet Take 81 mg by mouth daily.        . ciclopirox (PENLAC) 8 % solution Apply topically at bedtime. Apply over nail and surrounding skin. Apply daily over previous coat. After seven (7) days, may remove with alcohol and continue cycle.      . metoprolol tartrate (LOPRESSOR) 25 MG tablet Take 0.5 tablets (12.5 mg total) by mouth 2 (two) times daily.  90 tablet  3  . Multiple Vitamin (MULITIVITAMIN WITH MINERALS) TABS Take 1 tablet by mouth daily.      . nitroGLYCERIN (NITROSTAT) 0.4 MG SL tablet Place 1 tablet (0.4 mg total) under the tongue every 5 (five) minutes as needed. Up to 3 doses  25 tablet  2  . Omega-3 Fatty Acids (FISH OIL) 1000 MG CAPS Take by mouth daily.        Marland Kitchen omeprazole (PRILOSEC OTC) 20 MG tablet Take 2 tablets (40 mg total) by mouth daily.      . rosuvastatin (CRESTOR) 40 MG tablet Take 1 tablet (40 mg total) by mouth daily.  30 tablet  6   No current facility-administered medications for this visit.    Allergies  Allergen Reactions  . Amoxicillin Other (See Comments)    Stomach cramps    Past Medical History  Diagnosis Date  . ECZEMA   . PSORIASIS   . GLUCOSE INTOLERANCE 01/2010    a1c 6.0  . CORONARY ATHEROSCLEROSIS NATIVE CORONARY ARTERY 01/2010    DES x 2 RCA  . SINUS BRADYCARDIA   . ANXIETY   . DEPRESSION   . GERD   . HYPERLIPIDEMIA   . 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 Abdominal pain, chronic, left lower quadrant     Past Surgical History  Procedure Laterality Date  . 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 Mass excision (l) thigh  08/2009    AVM  . Coronary stent placement      2 stent placed in 2011    Family History  Problem Relation Age of Onset  . Heart disease Sister   . Cancer Brother     Prostate  . Hepatitis Other   . Seizures Brother     Isolated    History   Social History  . Marital Status: Married    Spouse Name: N/A    Number of Children: N/A  . Years of Education: N/A   Occupational History  . Not on file.   Social History Main Topics  . Smoking status: Never Smoker   . Smokeless tobacco: Never Used  . Alcohol Use: No  . Drug Use: No  . Sexually Active: Not on file   Other Topics Concern  . Not on file   Social History Narrative  . No narrative on file    ROS: Please see the HPI.  All other systems reviewed and negative.  PHYSICAL EXAM:  BP 141/83  Pulse 63  Ht 6' (1.829 m)  Wt 190 lb (86.183 kg)  BMI 25.76 kg/m2  SpO2 98%  General: Well developed, well nourished, in no acute distress. Head:  Normocephalic and atraumatic. Neck: no JVD Lungs: Clear to auscultation and percussion. Heart: Normal S1 and S2.  No murmur, rubs or gallops.  Abdomen:  Normal bowel sounds; soft; non tender; no organomegaly Pulses: Pulses normal in all 4 extremities. Extremities: No clubbing or cyanosis. No edema. Neurologic: Alert and oriented x 3.  EKG:  NSR.  Nonspecific ST flattening.    ASSESSMENT AND PLAN:

## 2012-04-13 NOTE — Patient Instructions (Addendum)
Your physician wants you to follow-up in: 6 MONTHS with Dr Excell Seltzer.  You will receive a reminder letter in the mail two months in advance. If you don't receive a letter, please call our office to schedule the follow-up appointment.  Your physician recommends that you continue on your current medications as directed. Please refer to the Current Medication list given to you today.  Your physician has requested that you have an exercise tolerance test. For further information please visit [email or web address] Please also follow instruction sheet, as given.  Per Dr [name] he would like the pt to have a GXT prior to traveling outside the [place]. The pt will call the office to schedule GXT if he does decide to travel. If the pt does not travel then Dr [name] would still like to schedule a GXT prior to his retirement.

## 2012-04-20 ENCOUNTER — Other Ambulatory Visit: Payer: Self-pay | Admitting: *Deleted

## 2012-04-20 DIAGNOSIS — E7800 Pure hypercholesterolemia, unspecified: Secondary | ICD-10-CM

## 2012-04-20 DIAGNOSIS — I2510 Atherosclerotic heart disease of native coronary artery without angina pectoris: Secondary | ICD-10-CM

## 2012-04-20 MED ORDER — METOPROLOL TARTRATE 25 MG PO TABS: 12.5000 mg | ORAL_TABLET | Freq: Two times a day (BID) | ORAL | Status: DC

## 2012-04-30 ENCOUNTER — Telehealth: Payer: Self-pay | Admitting: Cardiology

## 2012-04-30 DIAGNOSIS — I2510 Atherosclerotic heart disease of native coronary artery without angina pectoris: Secondary | ICD-10-CM

## 2012-04-30 NOTE — Telephone Encounter (Signed)
Pt calling re dr [name] wanting him to have a stress test by April , need order please

## 2012-04-30 NOTE — Telephone Encounter (Signed)
Order in the system.  Please contact pt with appt.

## 2012-04-30 NOTE — Telephone Encounter (Signed)
Pt scheduled for GXT on 05/21/12 with Dr [name]. Instruction sheet mailed to the pt's home.

## 2012-05-21 ENCOUNTER — Encounter: Payer: 59 | Admitting: Cardiology

## 2012-06-02 ENCOUNTER — Ambulatory Visit (INDEPENDENT_AMBULATORY_CARE_PROVIDER_SITE_OTHER): Payer: 59 | Admitting: Cardiology

## 2012-06-02 DIAGNOSIS — I2510 Atherosclerotic heart disease of native coronary artery without angina pectoris: Secondary | ICD-10-CM

## 2012-06-02 NOTE — Progress Notes (Signed)
Exercise Treadmill Test  Pre-Exercise Testing Evaluation Rhythm: Normal Rate: 75                 Test  Exercise Tolerance Test Ordering MD: Shawnie Pons, MD  Interpreting MD: Shawnie Pons, MD  Unique Test No: 1  Treadmill:  1  Indication for ETT: CAD  Contraindication to ETT: No   Stress Modality: exercise - treadmill  Cardiac Imaging Performed: non   Protocol: standard Bruce - maximal  Max BP:  211/77  Max MPHR (bpm):  167 85% MPR (bpm):  142  MPHR obtained (bpm):  169 % MPHR obtained:  101%  Reached 85% MPHR (min:sec):  7:57 Total Exercise Time (min-sec):  11:22  Workload in METS:  13.4 Borg Scale: 14  Reason ETT Terminated:  desired heart rate attained    ST Segment Analysis At Rest: normal ST segments - no evidence of significant ST depression With Exercise: no evidence of significant ST depression  Other Information Arrhythmia:  No Angina during ETT:  absent (0) Quality of ETT:  diagnostic  ETT Interpretation:  normal - no evidence of ischemia by ST analysis  Comments: Patient exercised on the Bruce protocol.  He had excellent exercise tolerance and no exercise induced chest pain or ST change. The study was negative for inducible ischemia.    Recommendations:  1..  Continue exercise and medical therapy.  The patient underwent RCA PCI and had moderate LAD disease.  He has been stable and has good exercise tolerance and without exercise induced chest pain or ST change.  Medical therapy on a continued basis is warranted.

## 2012-06-04 NOTE — Procedures (Signed)
Performed

## 2012-11-06 ENCOUNTER — Other Ambulatory Visit: Payer: Self-pay | Admitting: Cardiology

## 2012-12-06 ENCOUNTER — Other Ambulatory Visit: Payer: Self-pay | Admitting: Cardiovascular Disease

## 2013-01-05 ENCOUNTER — Other Ambulatory Visit: Payer: Self-pay | Admitting: Cardiovascular Disease

## 2013-01-06 ENCOUNTER — Other Ambulatory Visit: Payer: Self-pay

## 2013-02-05 ENCOUNTER — Other Ambulatory Visit: Payer: Self-pay | Admitting: Cardiovascular Disease

## 2013-03-07 ENCOUNTER — Other Ambulatory Visit: Payer: Self-pay | Admitting: Cardiovascular Disease

## 2013-04-06 ENCOUNTER — Encounter: Payer: 59 | Admitting: Internal Medicine

## 2013-04-08 ENCOUNTER — Encounter: Payer: Self-pay | Admitting: Internal Medicine

## 2013-04-08 ENCOUNTER — Ambulatory Visit (INDEPENDENT_AMBULATORY_CARE_PROVIDER_SITE_OTHER): Payer: 59 | Admitting: Internal Medicine

## 2013-04-08 ENCOUNTER — Other Ambulatory Visit (INDEPENDENT_AMBULATORY_CARE_PROVIDER_SITE_OTHER): Payer: 59

## 2013-04-08 VITALS — BP 120/76 | HR 54 | Temp 98.2°F | Ht 72.0 in | Wt 189.2 lb

## 2013-04-08 DIAGNOSIS — Z0000 Encounter for general adult medical examination without abnormal findings: Secondary | ICD-10-CM

## 2013-04-08 DIAGNOSIS — B351 Tinea unguium: Secondary | ICD-10-CM

## 2013-04-08 DIAGNOSIS — E739 Lactose intolerance, unspecified: Secondary | ICD-10-CM

## 2013-04-08 LAB — CBC WITH DIFFERENTIAL/PLATELET
Basophils Absolute: 0 10*3/uL (ref 0.0–0.1)
Basophils Relative: 0.5 % (ref 0.0–3.0)
EOS PCT: 1.8 % (ref 0.0–5.0)
Eosinophils Absolute: 0.2 10*3/uL (ref 0.0–0.7)
HCT: 46.2 % (ref 39.0–52.0)
HEMOGLOBIN: 15.2 g/dL (ref 13.0–17.0)
LYMPHS PCT: 45.1 % (ref 12.0–46.0)
Lymphs Abs: 4.3 10*3/uL — ABNORMAL HIGH (ref 0.7–4.0)
MCHC: 33 g/dL (ref 30.0–36.0)
MCV: 82.8 fl (ref 78.0–100.0)
MONOS PCT: 5.7 % (ref 3.0–12.0)
Monocytes Absolute: 0.5 10*3/uL (ref 0.1–1.0)
NEUTROS ABS: 4.5 10*3/uL (ref 1.4–7.7)
Neutrophils Relative %: 46.9 % (ref 43.0–77.0)
Platelets: 192 10*3/uL (ref 150.0–400.0)
RBC: 5.58 Mil/uL (ref 4.22–5.81)
RDW: 13 % (ref 11.5–14.6)
WBC: 9.6 10*3/uL (ref 4.5–10.5)

## 2013-04-08 LAB — LIPID PANEL
Cholesterol: 135 mg/dL (ref 0–200)
HDL: 41.1 mg/dL (ref >39.00)
LDL Cholesterol: 75 mg/dL (ref 0–99)
TRIGLYCERIDES: 96 mg/dL (ref 0.0–149.0)
Total CHOL/HDL Ratio: 3
VLDL: 19.2 mg/dL (ref 0.0–40.0)

## 2013-04-08 LAB — URINALYSIS, ROUTINE W REFLEX MICROSCOPIC
Bilirubin Urine: NEGATIVE
Hgb urine dipstick: NEGATIVE
KETONES UR: NEGATIVE
Leukocytes, UA: NEGATIVE
Nitrite: NEGATIVE
SPECIFIC GRAVITY, URINE: 1.015 (ref 1.000–1.030)
Total Protein, Urine: NEGATIVE
URINE GLUCOSE: NEGATIVE
Urobilinogen, UA: 0.2 (ref 0.0–1.0)
pH: 6 (ref 5.0–8.0)

## 2013-04-08 LAB — BASIC METABOLIC PANEL
BUN: 17 mg/dL (ref 6–23)
CO2: 30 meq/L (ref 19–32)
Calcium: 9.7 mg/dL (ref 8.4–10.5)
Chloride: 105 mEq/L (ref 96–112)
Creatinine, Ser: 0.8 mg/dL (ref 0.4–1.5)
GFR: 115.37 mL/min (ref >60.00)
GLUCOSE: 82 mg/dL (ref 70–99)
POTASSIUM: 4 meq/L (ref 3.5–5.1)
SODIUM: 139 meq/L (ref 135–145)

## 2013-04-08 LAB — HEMOGLOBIN A1C: HEMOGLOBIN A1C: 6 % (ref 4.6–6.5)

## 2013-04-08 LAB — HEPATIC FUNCTION PANEL
ALBUMIN: 4.7 g/dL (ref 3.5–5.2)
ALT: 24 U/L (ref 0–53)
AST: 17 U/L (ref 0–37)
Alkaline Phosphatase: 62 U/L (ref 39–117)
Bilirubin, Direct: 0.1 mg/dL (ref 0.0–0.3)
Total Bilirubin: 1 mg/dL (ref 0.3–1.2)
Total Protein: 7.2 g/dL (ref 6.0–8.3)

## 2013-04-08 LAB — PSA: PSA: 0.89 ng/mL (ref 0.10–4.00)

## 2013-04-08 LAB — TSH: TSH: 1.84 u[IU]/mL (ref 0.35–5.50)

## 2013-04-08 MED ORDER — CICLOPIROX 8 % EX SOLN: Freq: Every day | CUTANEOUS | Status: DC

## 2013-04-08 NOTE — Patient Instructions (Addendum)
It was good to see you today.  We have reviewed your prior records including labs and tests today  Health Maintenance reviewed - all recommended immunizations and age-appropriate screenings are up-to-date.  Test(s) ordered today. Your results will be released to Clare (or called to you) after review, usually within 72hours after test completion. If any changes need to be made, you will be notified at that same time.  Medications reviewed and updated, no changes recommended at this time. Refill on medication(s) as discussed today.  Please schedule followup in 12 months for annual exam and labs, call sooner if problems.

## 2013-04-08 NOTE — Progress Notes (Signed)
Jonathan Mcintosh [id number] 04/08/2013   Chief Complaint  Patient presents with  . Annual Exam    Subjective  HPI Patient is here for Annual Physical Exam. Reviewed chronic medical conditions and intervening medical events.   Past Medical History  Diagnosis Date  . ECZEMA   . PSORIASIS   . GLUCOSE INTOLERANCE 01/2010    a1c 6.0  . CORONARY ATHEROSCLEROSIS NATIVE CORONARY ARTERY 01/2010    DES x 2 RCA  . SINUS BRADYCARDIA   . ANXIETY   . DEPRESSION   . GERD   . HYPERLIPIDEMIA   . 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 Abdominal pain, chronic, left lower quadrant     Past Surgical History  Procedure Laterality Date  . 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 Mass excision (l) thigh  08/2009    AVM  . Coronary stent placement      2 stent placed in 2011    Family History  Problem Relation Age of Onset  . Heart disease Sister   . Cancer Brother     Prostate  . Hepatitis Other   . Seizures Brother     Isolated    History  Substance Use Topics  . Smoking status: Never Smoker   . Smokeless tobacco: Never Used  . Alcohol Use: No    Current Outpatient Prescriptions on File Prior to Visit  Medication Sig Dispense Refill  . aspirin 81 MG tablet Take 81 mg by mouth daily.        . CRESTOR 40 MG tablet TAKE 1 TABLET BY MOUTH EVERY DAY  30 tablet  1  . metoprolol tartrate (LOPRESSOR) 25 MG tablet Take 0.5 tablets (12.5 mg total) by mouth 2 (two) times daily.  90 tablet  3  . Multiple Vitamin (MULITIVITAMIN WITH MINERALS) TABS Take 1 tablet by mouth daily.      . nitroGLYCERIN (NITROSTAT) 0.4 MG SL tablet Place 1 tablet (0.4 mg total) under the tongue every 5 (five) minutes as needed. Up to 3 doses  25 tablet  2  . Omega-3 Fatty Acids (FISH OIL) 1000 MG CAPS Take by mouth daily.        Marland Kitchen omeprazole (PRILOSEC OTC) 20 MG tablet Take 2 tablets (40 mg total) by mouth  daily.       No current facility-administered medications on file prior to visit.    Allergies:  Allergies  Allergen Reactions  . Amoxicillin Other (See Comments)    Stomach cramps    Review of Systems  Constitutional: Negative for fever, chills, weight loss and malaise/fatigue.  HENT: Negative for congestion and nosebleeds.   Eyes: Negative for blurred vision.  Respiratory: Negative for cough, shortness of breath and wheezing.   Cardiovascular: Negative for chest pain.  Gastrointestinal: Negative for heartburn, nausea and vomiting.  Neurological: Negative for speech change and headaches.  Psychiatric/Behavioral: Negative for depression and suicidal ideas. The patient is not nervous/anxious and does not have insomnia.        Objective  Filed Vitals:   04/08/13 1412  BP: 120/76  Pulse: 54  Temp: 98.2 F (36.8 C)  TempSrc: Oral  Height: 6' (1.829 m)  Weight: 189 lb 3.2 oz (85.821 kg)  SpO2: 97%    Physical Exam  Constitutional: He is oriented to person, place, and time. He appears well-developed and well-nourished.  No distress.  HENT:  Head: Normocephalic and atraumatic.  Eyes: Pupils are equal, round, and reactive to light.  Neck: No JVD present.  Cardiovascular: Normal rate, regular rhythm, normal heart sounds and intact distal pulses.  Exam reveals no gallop and no friction rub.   No murmur heard. Respiratory: Effort normal and breath sounds normal. No stridor. No respiratory distress.  Lymphadenopathy:    He has no cervical adenopathy.  Neurological: He is alert and oriented to person, place, and time. No cranial nerve deficit.  Skin: Skin is warm and dry. He is not diaphoretic.  Psychiatric: He has a normal mood and affect. His speech is normal and behavior is normal. Judgment and thought content normal.    BP Readings from Last 3 Encounters:  04/08/13 120/76  04/13/12 141/83  11/12/11 141/80    Wt Readings from Last 3 Encounters:  04/08/13 189 lb 3.2  oz (85.821 kg)  04/13/12 190 lb (86.183 kg)  11/12/11 187 lb (84.823 kg)    Lab Results  Component Value Date   WBC 9.4 10/31/2011   HGB 15.4 10/31/2011   HCT 47.2 10/31/2011   PLT 205.0 10/31/2011   GLUCOSE 90 10/31/2011   CHOL 136 06/26/2011   TRIG 63.0 06/26/2011   HDL 46.40 06/26/2011   LDLDIRECT 189.8 06/28/2009   LDLCALC 77 06/26/2011   ALT 25 10/31/2011   AST 20 10/31/2011   NA 138 10/31/2011   K 3.6 10/31/2011   CL 103 10/31/2011   CREATININE 0.9 10/31/2011   BUN 19 10/31/2011   CO2 28 10/31/2011   TSH 1.26 08/21/2011   PSA 0.78 06/26/2011   HGBA1C 5.9 06/26/2011       Assessment and Plan  V70.0 Patient reports no new complaints.  Health diet and exercise reported by patient.  Will order CBC,MET, LFT, TSH, and Urin today.  Patient vaccines were updated.    MI- 2011  Patients reports not chest palpations, syncope, chest tightness or any other cardio complaints.  Patient is seeing cardiology (dr. Guillermina City) Will defer to cardiology for management.    Patient was instructed to notify the office if any new symptoms emerged or the current symptoms become worse.   No Follow-up on file. Theone Murdoch, Talitha Givens

## 2013-04-08 NOTE — Assessment & Plan Note (Signed)
Hx glucose intolerance a1c 6.0 2011 improved with intentional weight loss thru diet changes -   check a1c now to eval same Lab Results  Component Value Date   HGBA1C 5.9 06/26/2011

## 2013-04-08 NOTE — Progress Notes (Signed)
Subjective:      Patient ID: Jonathan Mcintosh, male    DOB: 1961/02/05, 53 y.o.   MRN: [id number]  HPI patient is here today for annual physical. Patient feels well overall.   Past Medical History  Diagnosis Date  . ECZEMA   . PSORIASIS   . GLUCOSE INTOLERANCE 01/2010    a1c 6.0  . CORONARY ATHEROSCLEROSIS NATIVE CORONARY ARTERY 01/2010    DES x 2 RCA  . SINUS BRADYCARDIA   . ANXIETY   . DEPRESSION   . GERD   . HYPERLIPIDEMIA   . 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 Abdominal pain, chronic, left lower quadrant    Family History  Problem Relation Age of Onset  . Heart disease Sister   . Cancer Brother     Prostate  . Hepatitis Other   . Seizures Brother     Isolated   History  Substance Use Topics  . Smoking status: Never Smoker   . Smokeless tobacco: Never Used  . Alcohol Use: No    Review of Systems  Constitutional: Negative for fever, activity change, appetite change, fatigue and unexpected weight change.  Respiratory: Negative for cough, chest tightness, shortness of breath and wheezing.   Cardiovascular: Negative for chest pain, palpitations and leg swelling.  Neurological: Negative for dizziness, weakness and headaches.  Psychiatric/Behavioral: Negative for dysphoric mood. The patient is not nervous/anxious.   All other systems reviewed and are negative.        Objective:   Physical Exam BP 120/76  Pulse 54  Temp(Src) 98.2 F (36.8 C) (Oral)  Ht 6' (1.829 m)  Wt 189 lb 3.2 oz (85.821 kg)  BMI 25.65 kg/m2  SpO2 97% Wt Readings from Last 3 Encounters:  04/08/13 189 lb 3.2 oz (85.821 kg)  04/13/12 190 lb (86.183 kg)  11/12/11 187 lb (84.823 kg)   Constitutional:  He appears well-developed and well-nourished. No distress.  HENT: NCAT, no sinus tenderness - OP clear - teeth in fair repair without obvious caries Eyes: PERRL, EOMI - no conjunctivitis Neck: Normal range of motion. Neck supple. No JVD  present. No thyromegaly present.  Cardiovascular: Normal rate, regular rhythm and normal heart sounds.  No murmur heard. no BLE edema Pulmonary/Chest: Effort normal and breath sounds normal. No respiratory distress. no wheezes.  Abdominal: Soft. Bowel sounds are normal. Patient exhibits no distension. There is no tenderness.  Musculoskeletal: Normal range of motion. Patient exhibits no edema.  Neurological: he is alert and oriented to person, place, and time. No cranial nerve deficit. Coordination normal.  Skin: Multiple SKs - scalp and torso; tiny hemangiomas on torso. Skin is warm and dry.  No erythema or ulceration.  Psychiatric: he has a normal mood and affect. behavior is normal. Judgment and thought content normal.   Lab Results  Component Value Date   WBC 9.4 10/31/2011   HGB 15.4 10/31/2011   HCT 47.2 10/31/2011   PLT 205.0 10/31/2011   GLUCOSE 90 10/31/2011   CHOL 136 06/26/2011   TRIG 63.0 06/26/2011   HDL 46.40 06/26/2011   LDLDIRECT 189.8 06/28/2009   LDLCALC 77 06/26/2011   ALT 25 10/31/2011   AST 20 10/31/2011   NA 138 10/31/2011   K 3.6 10/31/2011   CL 103 10/31/2011   CREATININE 0.9 10/31/2011   BUN 19 10/31/2011   CO2 28 10/31/2011   TSH 1.26 08/21/2011   PSA 0.78 06/26/2011  HGBA1C 5.9 06/26/2011       Assessment & Plan:   CPX/v70.0 - Patient has been counseled on age-appropriate routine health concerns for screening and prevention. These are reviewed and up-to-date. Immunizations are up-to-date or declined. Labs ordered and ECG reviewed.  L 3rd toenail fungal infection - PenLac rx'd - refill today

## 2013-04-08 NOTE — Progress Notes (Signed)
Pre-visit discussion using our clinic review tool. No additional management support is needed unless otherwise documented below in the visit note.  

## 2013-05-03 ENCOUNTER — Other Ambulatory Visit: Payer: Self-pay | Admitting: Cardiovascular Disease

## 2013-05-18 ENCOUNTER — Other Ambulatory Visit: Payer: Self-pay | Admitting: Cardiology

## 2013-06-03 ENCOUNTER — Other Ambulatory Visit: Payer: Self-pay | Admitting: Cardiovascular Disease

## 2013-06-08 ENCOUNTER — Other Ambulatory Visit: Payer: Self-pay | Admitting: Cardiovascular Disease

## 2013-08-15 ENCOUNTER — Ambulatory Visit (INDEPENDENT_AMBULATORY_CARE_PROVIDER_SITE_OTHER): Payer: 59 | Admitting: Family Medicine

## 2013-08-15 ENCOUNTER — Encounter: Payer: Self-pay | Admitting: Family Medicine

## 2013-08-15 ENCOUNTER — Other Ambulatory Visit (INDEPENDENT_AMBULATORY_CARE_PROVIDER_SITE_OTHER): Payer: 59

## 2013-08-15 VITALS — BP 142/82 | HR 83 | Ht 72.0 in | Wt 191.0 lb

## 2013-08-15 DIAGNOSIS — M79676 Pain in unspecified toe(s): Secondary | ICD-10-CM

## 2013-08-15 DIAGNOSIS — M19079 Primary osteoarthritis, unspecified ankle and foot: Secondary | ICD-10-CM | POA: Insufficient documentation

## 2013-08-15 DIAGNOSIS — M79609 Pain in unspecified limb: Secondary | ICD-10-CM

## 2013-08-15 NOTE — Assessment & Plan Note (Signed)
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 This makes me somewhat concern for possible autoimmune but will hold on further workup. Patient does have a history of psoriasis and this could be secondary to psoriatic arthritis. Patient did have an injection today tolerated the procedure very well. We discussed proper shoe choices became be beneficial as well as an icing regimen. Patient will do the topical anti-inflammatories Staley from oral anti-inflammatories secondary to patient's cardio risk factor. We discussed over-the-counter medications though that he may beneficial for his pain. Patient come back again in about 3 weeks for further evaluation. Continues to have trouble mainly consider custom orthotics.

## 2013-08-15 NOTE — Patient Instructions (Addendum)
Very nice to meet you Ice bath 20 minutes at end of day.  Voltaren gel topically 2 times daily for next week then as needed Vitamin D 2000 IU daily.  Turmeric 500mg  twice daily.  Rigid sole shoes such as keen, merrell, dansko and new balance.  No walking for exercise til the weekend.  Biking would be great! Come back in 3 weeks. Consider orthotics.

## 2013-08-15 NOTE — Progress Notes (Signed)
[facility] [address] [place], [place] [address] Phone: [phone] Subjective:     CC: Left first toe pain  DUK:[id number] Jonathan Mcintosh is a 53 y.o. male coming in with complaint of left toe pain. Patient has had this toe pain for years but seems to be getting worse. Patient states he used only affect him when he ran and now it affects him even with light walking. Patient describes it as more of a sharp pain with certain movements. Denies any numbness or weakness or any swelling. Patient states he can even have a throbbing pain at night. Patient rates the severity of 8/10. Does respond somewhat to anti-inflammatories which she takes regularly at this time. No association with any food.     Past medical history, social, surgical and family history all reviewed in electronic medical record.   Review of Systems: No headache, visual changes, nausea, vomiting, diarrhea, constipation, dizziness, abdominal pain, skin rash, fevers, chills, night sweats, weight loss, swollen lymph nodes, body aches, joint swelling, muscle aches, chest pain, shortness of breath, mood changes.   Objective Blood pressure 142/82, pulse 83, height 6' (1.829 m), weight 191 lb (86.637 kg), SpO2 95.00%.  General: No apparent distress alert and oriented x3 mood and affect normal, dressed appropriately.  HEENT: Pupils equal, extraocular movements intact  Respiratory: Patient's speak in full sentences and does not appear short of breath  Cardiovascular: No lower extremity edema, non tender, no erythema  Skin: Warm dry intact with no signs of infection or rash on extremities or on axial skeleton.  Abdomen: Soft nontender  Neuro: Cranial nerves II through XII are intact, neurovascularly intact in all extremities with 2+ DTRs and 2+ pulses.  Lymph: No lymphadenopathy of posterior or anterior cervical chain or axillae bilaterally.  Gait normal with good balance and coordination.  MSK:  Non  tender with full range of motion and good stability and symmetric strength and tone of shoulders, elbows, wrist, hip, knee and ankles bilaterally.  Foot exam shows the patient does have over pronation of the hindfoot bilaterally. Patient does have hallux rigidus severely on the left foot.  MSK US performed of: Left ankle and foot This study was ordered, performed, and interpreted by Charlann Boxer D.O.  Foot/Ankle:   All structures visualized.   Talar dome unremarkable  Ankle mortise without effusion. Peroneus longus and brevis tendons unremarkable on long and transverse views without sheath effusions. Posterior tibialis, flexor hallucis longus, and flexor digitorum longus tendons unremarkable on long and transverse views without sheath effusions. Achilles tendon visualized along length of tendon and unremarkable on long and transverse views without sheath effusion. Anterior Talofibular Ligament and Calcaneofibular Ligaments unremarkable and intact. Deltoid Ligament unremarkable and intact. Plantar fascia intact and without effusion, normal thickness. No increased doppler signal, cap sign, or thickening of tibial cortex. Power doppler signal normal. Ultrasound shows the patient does have mild osteophytic changes of the first MTP joint. Patient also has what appears to be erosions of the bone just proximal and distal to this joint.  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  After verbal consent patient was prepped with alcohol swabs and with a 25-gauge 1 inch needle under ultrasound guidance patient was injected with 0.5 cc of 0.5% Marcaine as well as 0.5 cc of Kenalog 40 mg/dL. Patient tolerated the procedure well with near complete resolution of pain. Post injection instructions given.     Impression and Recommendations:  This case required medical decision making of moderate complexity.

## 2013-08-17 ENCOUNTER — Encounter: Payer: Self-pay | Admitting: Cardiovascular Disease

## 2013-08-17 ENCOUNTER — Ambulatory Visit (INDEPENDENT_AMBULATORY_CARE_PROVIDER_SITE_OTHER): Payer: 59 | Admitting: Cardiovascular Disease

## 2013-08-17 VITALS — BP 128/81 | HR 54 | Ht 72.0 in | Wt 186.4 lb

## 2013-08-17 DIAGNOSIS — E785 Hyperlipidemia, unspecified: Secondary | ICD-10-CM

## 2013-08-17 DIAGNOSIS — I2510 Atherosclerotic heart disease of native coronary artery without angina pectoris: Secondary | ICD-10-CM

## 2013-08-17 NOTE — Patient Instructions (Signed)
Your physician wants you to follow-up in: 1 YEAR with Dr Cooper.  You will receive a reminder letter in the mail two months in advance. If you don't receive a letter, please call our office to schedule the follow-up appointment.  Your physician recommends that you continue on your current medications as directed. Please refer to the Current Medication list given to you today.  

## 2013-08-17 NOTE — Progress Notes (Signed)
    HPI:  53 year-old male presenting for follow-up of CAD. He's been followedby Dr Lia Foyer since 2011 when he presented with an acute inferior wall MI. He was treated with primary PCI using overlapping DES. He was noted to have moderate residual stenosis in the LAD at the time of his cath. His anginal symptom at that time was right parasternal pain that felt 'sharp' and severe.   He's done very well on medical therapy. Walks about 2 miles for exercise without exertional sx's. Denies chest pain, dyspnea, edema, orthopnea, PND. Reports compliance with medications. Looking forward to retirement in February 2016.  Outpatient Encounter Prescriptions as of 08/17/2013  Medication Sig  . aspirin 81 MG tablet Take 81 mg by mouth daily.    . ciclopirox (PENLAC) 8 % solution Apply topically at bedtime. Apply over nail and surrounding skin. Apply daily over previous coat. After seven (7) days, may remove with alcohol and continue cycle.  Marland Kitchen CRESTOR 40 MG tablet TAKE 1 TABLET BY MOUTH EVERY DAY  . metoprolol tartrate (LOPRESSOR) 25 MG tablet TAKE 1/2 TABLET BY MOUTH TWICE DAILY  . Multiple Vitamin (MULITIVITAMIN WITH MINERALS) TABS Take 1 tablet by mouth daily.  . nitroGLYCERIN (NITROSTAT) 0.4 MG SL tablet Place 1 tablet (0.4 mg total) under the tongue every 5 (five) minutes as needed. Up to 3 doses  . Omega-3 Fatty Acids (FISH OIL) 1000 MG CAPS Take by mouth daily.    Marland Kitchen omeprazole (PRILOSEC OTC) 20 MG tablet Take 2 tablets (40 mg total) by mouth daily.    Allergies  Allergen Reactions  . Amoxicillin Other (See Comments)    Stomach cramps    Past Medical History  Diagnosis Date  . ECZEMA   . PSORIASIS   . GLUCOSE INTOLERANCE 01/2010    a1c 6.0  . CORONARY ATHEROSCLEROSIS NATIVE CORONARY ARTERY 01/2010    DES x 2 RCA  . SINUS BRADYCARDIA   . ANXIETY   . DEPRESSION   . GERD   . HYPERLIPIDEMIA   . 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 Abdominal pain,  chronic, left lower quadrant     presumed episodic diverticular sx    ROS: Right foot pain (bonespur) with recent injection, otherwise negative except as per HPI  Ht 6' (1.829 m)  PHYSICAL EXAM: Pt is alert and oriented, NAD HEENT: normal Neck: JVP - normal, carotids 2+= without bruits Lungs: CTA bilaterally CV: RRR without murmur or gallop Abd: soft, NT, Positive BS, no hepatomegaly Ext: no C/C/E, distal pulses intact and equal Skin: warm/dry no rash  EKG:  Sinus brady 54 bpm, occasional PVC  ASSESSMENT AND PLAN: 1. CAD, native vessel. Stable without sx's of angina. Cath report from 2011 reviewed and noted to have moderate residual LAD stenosis. Meds reviewed and will continue same regimen. Encouraged to continue with regular daily exercise and seek medical attn for exertional chest pain or any sx's reminiscent of his MI in 2011.   2. Hyperlipidemia: lipids from February reviewed. Crestor 40 mg daily - lipids followed by Dr Asa Lente.  I will see him back in one year unless problems arise.   Sherren Mocha 08/17/2013 11:42 AM

## 2013-09-01 ENCOUNTER — Other Ambulatory Visit: Payer: Self-pay | Admitting: Cardiovascular Disease

## 2013-09-10 ENCOUNTER — Other Ambulatory Visit: Payer: Self-pay | Admitting: Cardiovascular Disease

## 2013-11-24 ENCOUNTER — Other Ambulatory Visit: Payer: Self-pay | Admitting: Dermatology

## 2013-12-01 ENCOUNTER — Encounter: Payer: Self-pay | Admitting: Internal Medicine

## 2013-12-16 ENCOUNTER — Other Ambulatory Visit: Payer: Self-pay

## 2014-02-19 ENCOUNTER — Other Ambulatory Visit: Payer: Self-pay | Admitting: Cardiovascular Disease

## 2014-02-20 ENCOUNTER — Other Ambulatory Visit: Payer: Self-pay

## 2014-06-12 ENCOUNTER — Other Ambulatory Visit: Payer: Self-pay | Admitting: Cardiovascular Disease

## 2014-07-17 ENCOUNTER — Encounter: Payer: Self-pay | Admitting: Family Medicine

## 2014-07-17 MED ORDER — DICLOFENAC SODIUM 1 % TD GEL: 2.0000 g | Freq: Four times a day (QID) | TRANSDERMAL | Status: DC

## 2014-08-07 ENCOUNTER — Other Ambulatory Visit: Payer: Self-pay | Admitting: Cardiovascular Disease

## 2014-08-10 ENCOUNTER — Other Ambulatory Visit: Payer: Self-pay | Admitting: Cardiovascular Disease

## 2014-08-11 NOTE — Telephone Encounter (Signed)
Per note 6.17.15

## 2014-09-08 ENCOUNTER — Other Ambulatory Visit: Payer: Self-pay | Admitting: Cardiovascular Disease

## 2014-10-24 ENCOUNTER — Encounter: Payer: Self-pay | Admitting: Nurse Practitioner

## 2014-10-24 ENCOUNTER — Ambulatory Visit (INDEPENDENT_AMBULATORY_CARE_PROVIDER_SITE_OTHER): Payer: 59 | Admitting: Nurse Practitioner

## 2014-10-24 VITALS — BP 132/82 | HR 66 | Ht 72.0 in | Wt 188.1 lb

## 2014-10-24 DIAGNOSIS — I259 Chronic ischemic heart disease, unspecified: Secondary | ICD-10-CM | POA: Diagnosis not present

## 2014-10-24 MED ORDER — METOPROLOL TARTRATE 25 MG PO TABS: 12.5000 mg | ORAL_TABLET | Freq: Two times a day (BID) | ORAL | Status: DC

## 2014-10-24 MED ORDER — NITROGLYCERIN 0.4 MG SL SUBL: 0.4000 mg | SUBLINGUAL_TABLET | SUBLINGUAL | Status: AC | PRN

## 2014-10-24 MED ORDER — ROSUVASTATIN CALCIUM 40 MG PO TABS: 40.0000 mg | ORAL_TABLET | Freq: Every day | ORAL | Status: DC

## 2014-10-24 NOTE — Progress Notes (Signed)
CARDIOLOGY OFFICE NOTE  Date:  10/24/2014    Jonathan Mcintosh Date of Birth: [birth year] Medical Record #[id number]  PCP:  Gwendolyn Grant, MD  Cardiologist:  Burt Knack    Chief Complaint  Patient presents with  . Coronary Artery Disease    One year check - seen for Dr. Burt Knack    History of Present Illness: Jonathan Mcintosh is a 54 y.o. male who presents today for a one year check. Seen for Dr. Burt Knack. He was previously followed by Dr Lia Foyer since 2011 when he presented with an acute inferior wall MI. He was treated with primary PCI using overlapping DES. He was noted to have moderate residual stenosis in the LAD at the time of his cath. His anginal symptom at that time was right parasternal pain that felt 'sharp' and severe.   Last seen back in June of 2015 and he was doing well.  Comes in today. Here alone. He is doing great. He retired earlier this year. He has moved to [place] but comes back rather often due to family. 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 He feels great. He is riding his bike at least 50 miles a week. No chest pain. Not short of breath. Needs meds refilled. Seeing PCP in the fall and will have his labs done. He is not fasting today. He is very happy with how he is doing. He may be considering getting a physician in [place] and is wondering if Dr. Burt Knack has any recommendations.   Past Medical History  Diagnosis Date  . ECZEMA   . PSORIASIS   . GLUCOSE INTOLERANCE 01/2010    a1c 6.0  . CORONARY ATHEROSCLEROSIS NATIVE CORONARY ARTERY 01/2010    DES x 2 RCA  . SINUS BRADYCARDIA   . ANXIETY   . DEPRESSION   . GERD   . HYPERLIPIDEMIA   . 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 Abdominal pain, chronic, left lower quadrant     presumed episodic diverticular sx    Past Surgical History  Procedure Laterality Date  . 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 Mass excision (l) thigh  08/2009    AVM  . Coronary stent placement      2 stent placed in 2011     Medications: Current Outpatient Prescriptions  Medication Sig Dispense Refill  . ciclopirox (PENLAC) 8 % solution Apply topically at bedtime. Apply over nail and surrounding skin. Apply daily over previous coat. After seven (7) days, may remove with alcohol and continue cycle. 6.6 mL 0  . metoprolol tartrate (LOPRESSOR) 25 MG tablet Take 0.5 tablets (12.5 mg total) by mouth 2 (two) times daily. 180 tablet 3  . Multiple Vitamin (MULITIVITAMIN WITH MINERALS) TABS Take 1 tablet by mouth daily.    . nitroGLYCERIN (NITROSTAT) 0.4 MG SL tablet Place 1 tablet (0.4 mg total) under the tongue every 5 (five) minutes as needed. Up to 3 doses 25 tablet 3  . omeprazole (PRILOSEC OTC) 20 MG tablet Take 2 tablets (40 mg total) by mouth daily.    . rosuvastatin (CRESTOR) 40 MG tablet Take 1 tablet (40 mg total) by mouth daily. 90 tablet 3  . aspirin 81 MG tablet Take 81 mg by mouth daily.       No current facility-administered medications for this visit.  Allergies: Allergies  Allergen Reactions  . Amoxicillin Other (See Comments)    Stomach cramps    Social History: The patient  reports that he has never smoked. He has never used smokeless tobacco. He reports that he does not drink alcohol or use illicit drugs.   Family History: The patient's family history includes Cancer in his brother; Heart disease in his sister; Hepatitis in his other; Seizures in his brother.   Review of Systems: Please see the history of present illness.   Otherwise, the review of systems is positive for none.   All other systems are reviewed and negative.   Physical Exam: VS:  BP 132/82 mmHg  Pulse 66  Ht 6' (1.829 m)  Wt 188 lb 1.9 oz (85.331 kg)  BMI 25.51 kg/m2  SpO2 99% .  BMI Body mass index is 25.51 kg/(m^2).  Wt Readings from Last 3 Encounters:  10/24/14 188 lb 1.9 oz (85.331 kg)  08/17/13 186  lb 6.4 oz (84.55 kg)  08/15/13 191 lb (86.637 kg)    General: Pleasant. Well developed, well nourished and in no acute distress.  HEENT: Normal. Neck: Supple, no JVD, carotid bruits, or masses noted.  Cardiac: Regular rate and rhythm. No murmurs, rubs, or gallops. No edema.  Respiratory:  Lungs are clear to auscultation bilaterally with normal work of breathing.  GI: Soft and nontender.  MS: No deformity or atrophy. Gait and ROM intact. Skin: Warm and dry. Color is normal.  Neuro:  Strength and sensation are intact and no gross focal deficits noted.  Psych: Alert, appropriate and with normal affect.   LABORATORY DATA:  EKG:  EKG is ordered today.   Lab Results  Component Value Date   WBC 9.6 04/08/2013   HGB 15.2 04/08/2013   HCT 46.2 04/08/2013   PLT 192.0 04/08/2013   GLUCOSE 82 04/08/2013   CHOL 135 04/08/2013   TRIG 96.0 04/08/2013   HDL 41.10 04/08/2013   LDLDIRECT 189.8 06/28/2009   LDLCALC 75 04/08/2013   ALT 24 04/08/2013   AST 17 04/08/2013   NA 139 04/08/2013   K 4.0 04/08/2013   CL 105 04/08/2013   CREATININE 0.8 04/08/2013   BUN 17 04/08/2013   CO2 30 04/08/2013   TSH 1.84 04/08/2013   PSA 0.89 04/08/2013   HGBA1C 6.0 04/08/2013    BNP (last 3 results) No results for input(s): BNP in the last 8760 hours.  ProBNP (last 3 results) No results for input(s): PROBNP in the last 8760 hours.   Other Studies Reviewed Today:   Assessment/Plan: 1. CAD, native vessel. Has had prior PCI to the RCA and has known moderate residual LAD stenosis from 2011. He remains asymptomatic. He is doing a great job at risk factor modification.   2. Hyperlipidemia: labs are checked by PCP.   Current medicines are reviewed with the patient today.  The patient does not have concerns regarding medicines other than what has been noted above.  The following changes have been made:  See above.  Labs/ tests ordered today include:    Orders Placed This Encounter  Procedures   . EKG 12-Lead     Disposition:   FU with Dr. Burt Knack in 1 year.    Patient is agreeable to this plan and will call if any problems develop in the interim.   Signed: Burtis Junes, RN, ANP-C 10/24/2014 2:13 PM  [facility] [address] [place], [place]  [address] Phone: [phone]  Fax: [phone]

## 2014-10-24 NOTE — Patient Instructions (Addendum)
We will be checking the following labs today - NONE   Medication Instructions:    Continue with your current medicines.   I have sent your refills in.    Testing/Procedures To Be Arranged:  N/A  Follow-Up:   See Dr. Burt Knack in one year    Other Special Instructions:   N/A  Call the [place] office at [phone] if you have any questions, problems or concerns.

## 2014-10-29 ENCOUNTER — Other Ambulatory Visit: Payer: Self-pay | Admitting: Cardiovascular Disease

## 2014-12-05 ENCOUNTER — Other Ambulatory Visit (INDEPENDENT_AMBULATORY_CARE_PROVIDER_SITE_OTHER): Payer: 59

## 2014-12-05 ENCOUNTER — Encounter: Payer: Self-pay | Admitting: Internal Medicine

## 2014-12-05 ENCOUNTER — Ambulatory Visit (INDEPENDENT_AMBULATORY_CARE_PROVIDER_SITE_OTHER): Payer: 59 | Admitting: Internal Medicine

## 2014-12-05 ENCOUNTER — Encounter: Payer: Self-pay | Admitting: Gastroenterology

## 2014-12-05 VITALS — BP 110/70 | HR 62 | Temp 97.7°F | Ht 72.0 in | Wt 183.5 lb

## 2014-12-05 DIAGNOSIS — Z23 Encounter for immunization: Secondary | ICD-10-CM

## 2014-12-05 DIAGNOSIS — N401 Enlarged prostate with lower urinary tract symptoms: Secondary | ICD-10-CM | POA: Diagnosis not present

## 2014-12-05 DIAGNOSIS — Z0000 Encounter for general adult medical examination without abnormal findings: Secondary | ICD-10-CM | POA: Diagnosis not present

## 2014-12-05 DIAGNOSIS — Z1159 Encounter for screening for other viral diseases: Secondary | ICD-10-CM | POA: Diagnosis not present

## 2014-12-05 DIAGNOSIS — Z114 Encounter for screening for human immunodeficiency virus [HIV]: Secondary | ICD-10-CM

## 2014-12-05 DIAGNOSIS — R739 Hyperglycemia, unspecified: Secondary | ICD-10-CM

## 2014-12-05 DIAGNOSIS — Z1211 Encounter for screening for malignant neoplasm of colon: Secondary | ICD-10-CM | POA: Diagnosis not present

## 2014-12-05 DIAGNOSIS — R351 Nocturia: Secondary | ICD-10-CM | POA: Diagnosis not present

## 2014-12-05 DIAGNOSIS — R7303 Prediabetes: Secondary | ICD-10-CM

## 2014-12-05 LAB — BASIC METABOLIC PANEL
BUN: 21 mg/dL (ref 6–23)
CALCIUM: 9.6 mg/dL (ref 8.4–10.5)
CO2: 29 mEq/L (ref 19–32)
CREATININE: 0.86 mg/dL (ref 0.40–1.50)
Chloride: 106 mEq/L (ref 96–112)
GFR: 97.91 mL/min (ref >60.00)
Glucose, Bld: 101 mg/dL — ABNORMAL HIGH (ref 70–99)
Potassium: 4.4 mEq/L (ref 3.5–5.1)
SODIUM: 142 meq/L (ref 135–145)

## 2014-12-05 LAB — CBC WITH DIFFERENTIAL/PLATELET
BASOS ABS: 0 10*3/uL (ref 0.0–0.1)
Basophils Relative: 0.5 % (ref 0.0–3.0)
EOS ABS: 0.2 10*3/uL (ref 0.0–0.7)
Eosinophils Relative: 2.8 % (ref 0.0–5.0)
HEMATOCRIT: 46.6 % (ref 39.0–52.0)
HEMOGLOBIN: 15.5 g/dL (ref 13.0–17.0)
LYMPHS PCT: 45.2 % (ref 12.0–46.0)
Lymphs Abs: 3.6 10*3/uL (ref 0.7–4.0)
MCHC: 33.3 g/dL (ref 30.0–36.0)
MCV: 80.2 fl (ref 78.0–100.0)
MONOS PCT: 5.6 % (ref 3.0–12.0)
Monocytes Absolute: 0.5 10*3/uL (ref 0.1–1.0)
Neutro Abs: 3.7 10*3/uL (ref 1.4–7.7)
Neutrophils Relative %: 45.9 % (ref 43.0–77.0)
PLATELETS: 207 10*3/uL (ref 150.0–400.0)
RBC: 5.8 Mil/uL (ref 4.22–5.81)
RDW: 13.8 % (ref 11.5–15.5)
WBC: 8 10*3/uL (ref 4.0–10.5)

## 2014-12-05 LAB — URINALYSIS, ROUTINE W REFLEX MICROSCOPIC
Bilirubin Urine: NEGATIVE
Hgb urine dipstick: NEGATIVE
KETONES UR: NEGATIVE
Leukocytes, UA: NEGATIVE
Nitrite: NEGATIVE
PH: 7 (ref 5.0–8.0)
RBC / HPF: NONE SEEN (ref 0–?)
SPECIFIC GRAVITY, URINE: 1.02 (ref 1.000–1.030)
TOTAL PROTEIN, URINE-UPE24: NEGATIVE
UROBILINOGEN UA: 1 (ref 0.0–1.0)
Urine Glucose: NEGATIVE
WBC, UA: NONE SEEN (ref 0–?)

## 2014-12-05 LAB — HEPATIC FUNCTION PANEL
ALK PHOS: 66 U/L (ref 39–117)
ALT: 23 U/L (ref 0–53)
AST: 18 U/L (ref 0–37)
Albumin: 4.6 g/dL (ref 3.5–5.2)
BILIRUBIN DIRECT: 0.1 mg/dL (ref 0.0–0.3)
TOTAL PROTEIN: 7 g/dL (ref 6.0–8.3)
Total Bilirubin: 0.6 mg/dL (ref 0.2–1.2)

## 2014-12-05 LAB — LIPID PANEL
CHOL/HDL RATIO: 3
Cholesterol: 131 mg/dL (ref 0–200)
HDL: 45.2 mg/dL (ref >39.00)
LDL Cholesterol: 72 mg/dL (ref 0–99)
NONHDL: 86.21
Triglycerides: 72 mg/dL (ref 0.0–149.0)
VLDL: 14.4 mg/dL (ref 0.0–40.0)

## 2014-12-05 LAB — TSH: TSH: 1.19 u[IU]/mL (ref 0.35–4.50)

## 2014-12-05 LAB — PSA: PSA: 0.91 ng/mL (ref 0.10–4.00)

## 2014-12-05 LAB — HEMOGLOBIN A1C: HEMOGLOBIN A1C: 5.8 % (ref 4.6–6.5)

## 2014-12-05 MED ORDER — METOPROLOL TARTRATE 25 MG PO TABS: 12.5000 mg | ORAL_TABLET | Freq: Two times a day (BID) | ORAL | Status: DC

## 2014-12-05 MED ORDER — ROSUVASTATIN CALCIUM 40 MG PO TABS: 40.0000 mg | ORAL_TABLET | Freq: Every day | ORAL | Status: DC

## 2014-12-05 MED ORDER — CICLOPIROX 8 % EX SOLN: Freq: Every day | CUTANEOUS | Status: AC

## 2014-12-05 NOTE — Patient Instructions (Addendum)
It was good to see you today.  We have reviewed your prior records including labs and tests today  We'll make referral to Dr. Ardis Hughs for your 3 year colonoscopy follow-up as due -my office will call you regarding this appointment once arranged  Health Maintenance reviewed - annual flu shot administered today. All other recommended immunizations and age-appropriate screenings are up-to-date.  Test(s) ordered today. Your results will be released to [facility] (or called to you) after review, usually within 72hours after test completion. If any changes need to be made, you will be notified at that same time.  Medications reviewed and updated, no changes recommended at this time.  Please schedule followup in 12 months for annual exam and labs, call sooner if problems.   Health Maintenance A healthy lifestyle and preventative care can promote health and wellness.  Maintain regular health, dental, and eye exams.  Eat a healthy diet. Foods like vegetables, fruits, whole grains, low-fat dairy products, and lean protein foods contain the nutrients you need and are low in calories. Decrease your intake of foods high in solid fats, added sugars, and salt. Get information about a proper diet from your health care provider, if necessary.  Regular physical exercise is one of the most important things you can do for your health. Most adults should get at least 150 minutes of moderate-intensity exercise (any activity that increases your heart rate and causes you to sweat) each week. In addition, most adults need muscle-strengthening exercises on 2 or more days a week.   Maintain a healthy weight. The body mass index (BMI) is a screening tool to identify possible weight problems. It provides an estimate of body fat based on height and weight. Your health care provider can find your BMI and can help you achieve or maintain a healthy weight. For males 20 years and older:  A BMI below 18.5 is considered  underweight.  A BMI of 18.5 to 24.9 is normal.  A BMI of 25 to 29.9 is considered overweight.  A BMI of 30 and above is considered obese.  Maintain normal blood lipids and cholesterol by exercising and minimizing your intake of saturated fat. Eat a balanced diet with plenty of fruits and vegetables. Blood tests for lipids and cholesterol should begin at age 44 and be repeated every 5 years. If your lipid or cholesterol levels are high, you are over age 27, or you are at high risk for heart disease, you may need your cholesterol levels checked more frequently.Ongoing high lipid and cholesterol levels should be treated with medicines if diet and exercise are not working.  If you smoke, find out from your health care provider how to quit. If you do not use tobacco, do not start.  Lung cancer screening is recommended for adults aged 61-80 years who are at high risk for developing lung cancer because of a history of smoking. A yearly low-dose CT scan of the lungs is recommended for people who have at least a 30-pack-year history of smoking and are current smokers or have quit within the past 15 years. A pack year of smoking is smoking an average of 1 pack of cigarettes a day for 1 year (for example, a 30-pack-year history of smoking could mean smoking 1 pack a day for 30 years or 2 packs a day for 15 years). Yearly screening should continue until the smoker has stopped smoking for at least 15 years. Yearly screening should be stopped for people who develop a health problem that  would prevent them from having lung cancer treatment.  If you choose to drink alcohol, do not have more than 2 drinks per day. One drink is considered to be 12 oz (360 mL) of beer, 5 oz (150 mL) of wine, or 1.5 oz (45 mL) of liquor.  Avoid the use of street drugs. Do not share needles with anyone. Ask for help if you need support or instructions about stopping the use of drugs.  High blood pressure causes heart disease and  increases the risk of stroke. Blood pressure should be checked at least every 1-2 years. Ongoing high blood pressure should be treated with medicines if weight loss and exercise are not effective.  If you are 64-69 years old, ask your health care provider if you should take aspirin to prevent heart disease.  Diabetes screening involves taking a blood sample to check your fasting blood sugar level. This should be done once every 3 years after age 17 if you are at a normal weight and without risk factors for diabetes. Testing should be considered at a younger age or be carried out more frequently if you are overweight and have at least 1 risk factor for diabetes.  Colorectal cancer can be detected and often prevented. Most routine colorectal cancer screening begins at the age of 25 and continues through age 64. However, your health care provider may recommend screening at an earlier age if you have risk factors for colon cancer. On a yearly basis, your health care provider may provide home test kits to check for hidden blood in the stool. A small camera at the end of a tube may be used to directly examine the colon (sigmoidoscopy or colonoscopy) to detect the earliest forms of colorectal cancer. Talk to your health care provider about this at age 36 when routine screening begins. A direct exam of the colon should be repeated every 5-10 years through age 32, unless early forms of precancerous polyps or small growths are found.  People who are at an increased risk for hepatitis B should be screened for this virus. You are considered at high risk for hepatitis B if:  You were born in a country where hepatitis B occurs often. Talk with your health care provider about which countries are considered high risk.  Your parents were born in a high-risk country and you have not received a shot to protect against hepatitis B (hepatitis B vaccine).  You have HIV or AIDS.  You use needles to inject street  drugs.  You live with, or have sex with, someone who has hepatitis B.  You are a man who has sex with other men (MSM).  You get hemodialysis treatment.  You take certain medicines for conditions like cancer, organ transplantation, and autoimmune conditions.  Hepatitis C blood testing is recommended for all people born from [birth year] through 1966 and any individual with known risk factors for hepatitis C.  Healthy men should no longer receive prostate-specific antigen (PSA) blood tests as part of routine cancer screening. Talk to your health care provider about prostate cancer screening.  Testicular cancer screening is not recommended for adolescents or adult males who have no symptoms. Screening includes self-exam, a health care provider exam, and other screening tests. Consult with your health care provider about any symptoms you have or any concerns you have about testicular cancer.  Practice safe sex. Use condoms and avoid high-risk sexual practices to reduce the spread of sexually transmitted infections (STIs).  You should be screened  for STIs, including gonorrhea and chlamydia if:  You are sexually active and are younger than 24 years.  You are older than 24 years, and your health care provider tells you that you are at risk for this type of infection.  Your sexual activity has changed since you were last screened, and you are at an increased risk for chlamydia or gonorrhea. Ask your health care provider if you are at risk.  If you are at risk of being infected with HIV, it is recommended that you take a prescription medicine daily to prevent HIV infection. This is called pre-exposure prophylaxis (PrEP). You are considered at risk if:  You are a man who has sex with other men (MSM).  You are a heterosexual man who is sexually active with multiple partners.  You take drugs by injection.  You are sexually active with a partner who has HIV.  Talk with your health care provider about  whether you are at high risk of being infected with HIV. If you choose to begin PrEP, you should first be tested for HIV. You should then be tested every 3 months for as long as you are taking PrEP.  Use sunscreen. Apply sunscreen liberally and repeatedly throughout the day. You should seek shade when your shadow is shorter than you. Protect yourself by wearing long sleeves, pants, a wide-brimmed hat, and sunglasses year round whenever you are outdoors.  Tell your health care provider of new moles or changes in moles, especially if there is a change in shape or color. Also, tell your health care provider if a mole is larger than the size of a pencil eraser.  A one-time screening for abdominal aortic aneurysm (AAA) and surgical repair of large AAAs by ultrasound is recommended for men aged 65-75 years who are current or former smokers.  Stay current with your vaccines (immunizations). Document Released: 08/16/2007 Document Revised: 02/22/2013 Document Reviewed: 07/15/2010 [facility] Patient Information 2015 [place], [place]. This information is not intended to replace advice given to you by your health care provider. Make sure you discuss any questions you have with your health care provider.

## 2014-12-05 NOTE — Assessment & Plan Note (Signed)
Hx glucose intolerance a1c 6.0 2011 improved with intentional weight loss thru diet changes -   check a1c now to eval same Lab Results  Component Value Date   HGBA1C 6.0 04/08/2013

## 2014-12-05 NOTE — Assessment & Plan Note (Signed)
Mild increasing nocturnal symptoms over the past 6-12 months. No overt obstruction. Check PSA now and recommend trial of medical therapies. If increasing problems or elevated PSA, we will make referral to specialist for further evaluation and treatment of same Lab Results  Component Value Date   PSA 0.89 04/08/2013   PSA 0.78 06/26/2011   PSA 0.76 07/09/2010

## 2014-12-05 NOTE — Progress Notes (Signed)
Subjective:    Patient ID: Jonathan Mcintosh, male    DOB: Jan 17, 1961, 54 y.o.   MRN: [id number]  HPI  patient is here today for annual physical. Patient feels well in general. Also reviewed chronic medical conditions, interval events and current concerns   Past Medical History  Diagnosis Date  . ECZEMA   . PSORIASIS   . GLUCOSE INTOLERANCE 01/2010    a1c 6.0  . CORONARY ATHEROSCLEROSIS NATIVE CORONARY ARTERY 01/2010    DES x 2 RCA  . SINUS BRADYCARDIA   . ANXIETY   . DEPRESSION   . GERD   . HYPERLIPIDEMIA   . 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 Heart disease Sister   . Prostate cancer Brother   . Hepatitis Other   . Seizures Brother     Isolated   Social History  Substance Use Topics  . Smoking status: Never Smoker   . Smokeless tobacco: Never Used  . Alcohol Use: No     Review of Systems  Constitutional: Negative for fever, activity change, appetite change, fatigue and unexpected weight change.  Respiratory: Negative for cough, chest tightness, shortness of breath and wheezing.   Cardiovascular: Negative for chest pain, palpitations and leg swelling.  Genitourinary: Positive for urgency, decreased urine volume and difficulty urinating. Negative for dysuria, hematuria, flank pain, scrotal swelling and testicular pain. Frequency: nocturia, small vol with hesitation.  Musculoskeletal: Back pain: occ flare - last 2 mo ago - tx at [facility] ED, resolved.  Neurological: Negative for dizziness, weakness and headaches.  Psychiatric/Behavioral: Negative for dysphoric mood. The patient is not nervous/anxious.   All other systems reviewed and are negative.      Objective:    Physical Exam  Constitutional: He is oriented to person, place, and time. He appears well-developed and well-nourished. No distress.  HENT:  Head: Normocephalic and atraumatic.  Nose: Nose normal.    Mouth/Throat: Oropharynx is clear and moist.  Hearing grossly normal.  Eyes: Conjunctivae and EOM are normal. Pupils are equal, round, and reactive to light. No scleral icterus.  Neck: Normal range of motion. Neck supple. No JVD present. No thyromegaly present.  Cardiovascular: Normal rate, regular rhythm, normal heart sounds and intact distal pulses.  Exam reveals no friction rub.   No murmur heard. No edema.  Pulmonary/Chest: Effort normal and breath sounds normal. No respiratory distress. He has no wheezes.  Abdominal: Soft. Bowel sounds are normal. He exhibits no distension and no mass. There is no tenderness. There is no guarding.  Genitourinary:  defer  Musculoskeletal: Normal range of motion. He exhibits no edema or tenderness.  Lymphadenopathy:    He has no cervical adenopathy.  Neurological: He is alert and oriented to person, place, and time. He has normal reflexes. No cranial nerve deficit.  Skin: Skin is warm and dry. No rash noted. No erythema.  Psychiatric: He has a normal mood and affect. His behavior is normal. Thought content normal.    BP 110/70 mmHg  Pulse 62  Temp(Src) 97.7 F (36.5 C) (Oral)  Ht 6' (1.829 m)  Wt 183 lb 8 oz (83.235 kg)  BMI 24.88 kg/m2  SpO2 97% Wt Readings from Last 3 Encounters:  12/05/14 183 lb 8 oz (83.235 kg)  10/24/14 188 lb 1.9 oz (85.331 kg)  08/17/13 186 lb 6.4 oz (84.55 kg)     Lab Results  Component Value Date   WBC 9.6 04/08/2013   HGB 15.2 04/08/2013   HCT 46.2 04/08/2013   PLT 192.0 04/08/2013   GLUCOSE 82 04/08/2013   CHOL 135 04/08/2013   TRIG 96.0 04/08/2013   HDL 41.10 04/08/2013   LDLDIRECT 189.8 06/28/2009   LDLCALC 75 04/08/2013   ALT 24 04/08/2013   AST 17 04/08/2013   NA 139 04/08/2013   K 4.0 04/08/2013   CL 105 04/08/2013   CREATININE 0.8 04/08/2013   BUN 17 04/08/2013   CO2 30 04/08/2013   TSH 1.84 04/08/2013   PSA 0.89 04/08/2013   HGBA1C 6.0 04/08/2013    Ct Abdomen Pelvis W  Contrast  11/06/2011   *RADIOLOGY REPORT*  Clinical Data: Left lower quadrant pain.  CT ABDOMEN AND PELVIS WITH CONTRAST  Technique:  Multidetector CT imaging of the abdomen and pelvis was performed following the standard protocol during bolus administration of intravenous contrast.  Contrast: 140mL OMNIPAQUE IOHEXOL 300 MG/ML  SOLN  Comparison: None  Findings: Lung bases are clear.  No pericardial fluid.  There is a 8 mm low density lesion in the superior left hepatic lead cannot be fully characterized (image 16).  Gallbladder, pancreas, adrenal glands are normal.  There is low density lesion in the spleen measuring 6 mm (image 20).  The stomach, small bowel, cecum are normal.  Along the descending colon there is mild pericolonic fat stranding and inflammation. There are several smaller diverticula through this region.  These findings are most consistent with mild acute diverticulitis of the descending colon (images 46 through 9).  There are several diverticula the sigmoid colon without acute inflammation in the rectum is normal.  Abdominal aorta normal caliber.  No retroperitoneal periportal lymphadenopathy.  No free fluid the pelvis.  No pelvic lymphadenopathy. Review of bone windows demonstrates no aggressive osseous lesions.  IMPRESSION:  1.  Mild acute non complicated diverticulitis of the descending colon. 2.  Tiny hypodensity within the liver cannot be fully characterize. This is likely a benign finding.   As hepatic abscesses  are associated with diverticulitis, if the patient developed such symptomology,  consider follow-up imaging (ultrasound or MRI).  This was made a call report.   Original Report Authenticated By: Suzy Bouchard, M.D.       Assessment & Plan:   CPX/z00.00 - Patient has been counseled on age-appropriate routine health concerns for screening and prevention. These are reviewed and up-to-date. Immunizations are up-to-date or declined. Labs ordered and reviewed.  Problem List Items  Addressed This Visit    Borderline diabetes    Hx glucose intolerance a1c 6.0 2011 improved with intentional weight loss thru diet changes -   check a1c now to eval same Lab Results  Component Value Date   HGBA1C 6.0 04/08/2013        BPH associated with nocturia    Mild increasing nocturnal symptoms over the past 6-12 months. No overt obstruction. Check PSA now and recommend trial of medical therapies. If increasing problems or elevated PSA, we will make referral to specialist for further evaluation and treatment of same Lab Results  Component Value Date   PSA 0.89 04/08/2013   PSA 0.78 06/26/2011   PSA 0.76 07/09/2010          Other Visit Diagnoses    Routine general medical examination at a health care facility    -  Primary    Relevant Orders    Basic metabolic panel    CBC with Differential/Platelet  Hepatic function panel    Lipid panel    TSH    Urinalysis, Routine w reflex microscopic (not at [facility])    PSA    Need for prophylactic vaccination and inoculation against influenza        Relevant Orders    Flu Vaccine QUAD 36+ mos IM    Need for hepatitis C screening test        Relevant Orders    Hepatitis C antibody    Screening for HIV without presence of risk factors        Relevant Orders    HIV antibody    Special screening for malignant neoplasms, colon        Relevant Orders    Ambulatory referral to Gastroenterology    Hyperglycemia        Relevant Orders    Hemoglobin A1c        Gwendolyn Grant, MD

## 2014-12-05 NOTE — Progress Notes (Signed)
Pre visit review using our clinic review tool, if applicable. No additional management support is needed unless otherwise documented below in the visit note. 

## 2014-12-06 LAB — HIV ANTIBODY (ROUTINE TESTING W REFLEX): HIV 1&2 Ab, 4th Generation: NONREACTIVE

## 2014-12-06 LAB — HEPATITIS C ANTIBODY: HCV Ab: NEGATIVE

## 2015-01-23 ENCOUNTER — Ambulatory Visit (AMBULATORY_SURGERY_CENTER): Payer: Self-pay | Admitting: *Deleted

## 2015-01-23 VITALS — Ht 72.0 in | Wt 191.8 lb

## 2015-01-23 DIAGNOSIS — Z86010 Personal history of colonic polyps: Secondary | ICD-10-CM

## 2015-01-23 MED ORDER — NA SULFATE-K SULFATE-MG SULF 17.5-3.13-1.6 GM/177ML PO SOLN: 1.0000 | Freq: Once | ORAL | Status: DC

## 2015-01-23 NOTE — Progress Notes (Signed)
No egg or soy allergy No issues with past sedation No diet pills No home 02 use   emmi video to e mail    [email or web address]

## 2015-02-06 ENCOUNTER — Ambulatory Visit (AMBULATORY_SURGERY_CENTER): Payer: 59 | Admitting: Gastroenterology

## 2015-02-06 ENCOUNTER — Encounter: Payer: Self-pay | Admitting: Gastroenterology

## 2015-02-06 VITALS — BP 112/69 | HR 56 | Temp 97.6°F | Resp 16 | Ht 72.0 in | Wt 191.0 lb

## 2015-02-06 DIAGNOSIS — D128 Benign neoplasm of rectum: Secondary | ICD-10-CM

## 2015-02-06 DIAGNOSIS — D129 Benign neoplasm of anus and anal canal: Secondary | ICD-10-CM

## 2015-02-06 DIAGNOSIS — Z86010 Personal history of colonic polyps: Secondary | ICD-10-CM | POA: Diagnosis not present

## 2015-02-06 MED ORDER — SODIUM CHLORIDE 0.9 % IV SOLN: 500.0000 mL | INTRAVENOUS | Status: DC

## 2015-02-06 NOTE — Progress Notes (Signed)
Called to room to assist during endoscopic procedure.  Patient ID and intended procedure confirmed with present staff. Received instructions for my participation in the procedure from the performing physician.  

## 2015-02-06 NOTE — Patient Instructions (Addendum)
YOU HAD AN ENDOSCOPIC PROCEDURE TODAY AT [facility]:   Refer to the procedure report that was given to you for any specific questions about what was found during the examination.  If the procedure report does not answer your questions, please call your gastroenterologist to clarify.  If you requested that your care partner not be given the details of your procedure findings, then the procedure report has been included in a sealed envelope for you to review at your convenience later.  YOU SHOULD EXPECT: Some feelings of bloating in the abdomen. Passage of more gas than usual.  Walking can help get rid of the air that was put into your GI tract during the procedure and reduce the bloating. If you had a lower endoscopy (such as a colonoscopy or flexible sigmoidoscopy) you may notice spotting of blood in your stool or on the toilet paper. If you underwent a bowel prep for your procedure, you may not have a normal bowel movement for a few days.  Please Note:  You might notice some irritation and congestion in your nose or some drainage.  This is from the oxygen used during your procedure.  There is no need for concern and it should clear up in a day or so.  SYMPTOMS TO REPORT IMMEDIATELY:   Following lower endoscopy (colonoscopy or flexible sigmoidoscopy):  Excessive amounts of blood in the stool  Significant tenderness or worsening of abdominal pains  Swelling of the abdomen that is new, acute  Fever of 100F or higher   For urgent or emergent issues, a gastroenterologist can be reached at any hour by calling [phone].   DIET: Your first meal following the procedure should be a small meal and then it is ok to progress to your normal diet. Heavy or fried foods are harder to digest and may make you feel nauseous or bloated.  Likewise, meals heavy in dairy and vegetables can increase bloating.  Drink plenty of fluids but you should avoid alcoholic beverages for 24  hours.  ACTIVITY:  You should plan to take it easy for the rest of today and you should NOT DRIVE or use heavy machinery until tomorrow (because of the sedation medicines used during the test).    FOLLOW UP: Our staff will call the number listed on your records the next business day following your procedure to check on you and address any questions or concerns that you may have regarding the information given to you following your procedure. If we do not reach you, we will leave a message.  However, if you are feeling well and you are not experiencing any problems, there is no need to return our call.  We will assume that you have returned to your regular daily activities without incident.  If any biopsies were taken you will be contacted by phone or by letter within the next 1-3 weeks.  Please call us at [phone] if you have not heard about the biopsies in 3 weeks.    SIGNATURES/CONFIDENTIALITY: You and/or your care partner have signed paperwork which will be entered into your electronic medical record.  These signatures attest to the fact that that the information above on your After Visit Summary has been reviewed and is understood.  Full responsibility of the confidentiality of this discharge information lies with you and/or your care-partner.  Red all of the handouts given to you by your recovery room nurse.

## 2015-02-06 NOTE — Op Note (Signed)
[place]  [employer] [place], [id number]   COLONOSCOPY PROCEDURE REPORT  PATIENT: Jonathan Mcintosh, Jonathan Mcintosh  MR#: [id number] BIRTHDATE: 1960-10-05 , 75  yrs. old GENDER: male ENDOSCOPIST: Milus Banister, MD PROCEDURE DATE:  02/06/2015 PROCEDURE:   Colonoscopy, surveillance and Colonoscopy with snare polypectomy First Screening Colonoscopy - Avg.  risk and is 50 yrs.  old or older - No.  Prior Negative Screening - Now for repeat screening. N/A  History of Adenoma - Now for follow-up colonoscopy & has been > or = to 3 yrs.  Yes hx of adenoma.  Has been 3 or more years since last colonoscopy.  Polyps removed today? Yes ASA CLASS:   Class II INDICATIONS:Surveillance due to prior colonic neoplasia and colonoscopy 2013 Dr.  Ardis Hughs 3 subCM TAs. MEDICATIONS: Monitored anesthesia care and Propofol 350 mg IV  DESCRIPTION OF PROCEDURE:   After the risks benefits and alternatives of the procedure were thoroughly explained, informed consent was obtained.  The digital rectal exam revealed no abnormalities of the rectum.   The LB [id number]  endoscope was introduced through the anus and advanced to the cecum, which was identified by both the appendix and ileocecal valve. No adverse events experienced.   The quality of the prep was excellent.  The instrument was then slowly withdrawn as the colon was fully examined. Estimated blood loss is zero unless otherwise noted in this procedure report.  COLON FINDINGS: A sessile polyp measuring 5 mm in size was found in the rectum.  A polypectomy was performed with a cold snare.  The resection was complete, the polyp tissue was completely retrieved and sent to histology.  Retroflexed views revealed no abnormalities. The time to cecum = 3.0 Withdrawal time = 9.2   The scope was withdrawn and the procedure completed. COMPLICATIONS: There were no immediate complications.  ENDOSCOPIC IMPRESSION: Sessile polyp was found in the  rectum; polypectomy was performed with a cold snare The examination was otherwise normal.  RECOMMENDATIONS: If the polyp(s) removed today are proven to be adenomatous (pre-cancerous) polyps, you will need a repeat colonoscopy in 5 years.  You will receive a letter within 1-2 weeks with the results of your biopsy as well as final recommendations.  Please call my office if you have not received a letter after 3 weeks.  eSigned:  Milus Banister, MD 02/06/2015 11:07 AM

## 2015-02-06 NOTE — Progress Notes (Signed)
Patient awakening,vss,report to rn 

## 2015-02-07 ENCOUNTER — Telehealth: Payer: Self-pay | Admitting: *Deleted

## 2015-02-07 NOTE — Telephone Encounter (Signed)
  Follow up Call-  Call back number 02/06/2015  Post procedure Call Back phone  # [phone]  Permission to leave phone message Yes     Patient questions:  Do you have a fever, pain , or abdominal swelling? No. Pain Score  0 *  Have you tolerated food without any problems? Yes.    Have you been able to return to your normal activities? Yes.    Do you have any questions about your discharge instructions: Diet   No. Medications  No. Follow up visit  No.  Do you have questions or concerns about your Care? No.  Actions: * If pain score is 4 or above: No action needed, pain <4.

## 2015-02-14 ENCOUNTER — Encounter: Payer: Self-pay | Admitting: Gastroenterology

## 2015-03-29 ENCOUNTER — Telehealth: Payer: Self-pay | Admitting: Family Medicine

## 2015-03-29 NOTE — Telephone Encounter (Signed)
Spoke with patient. Advised adding Tylenol until he is seen Feb 7th.

## 2015-03-29 NOTE — Telephone Encounter (Signed)
Pt is not managing his pain very well and is wondering if there is something else you recommend he try.  I can't remember if he said Advil or Aleve

## 2015-04-10 ENCOUNTER — Encounter: Payer: Self-pay | Admitting: Family Medicine

## 2015-04-10 ENCOUNTER — Ambulatory Visit (INDEPENDENT_AMBULATORY_CARE_PROVIDER_SITE_OTHER): Payer: 59 | Admitting: Family Medicine

## 2015-04-10 VITALS — BP 128/84 | HR 53 | Wt 192.0 lb

## 2015-04-10 DIAGNOSIS — M25511 Pain in right shoulder: Secondary | ICD-10-CM | POA: Insufficient documentation

## 2015-04-10 DIAGNOSIS — M1990 Unspecified osteoarthritis, unspecified site: Secondary | ICD-10-CM | POA: Diagnosis not present

## 2015-04-10 DIAGNOSIS — M19079 Primary osteoarthritis, unspecified ankle and foot: Secondary | ICD-10-CM

## 2015-04-10 MED ORDER — DICLOFENAC SODIUM 2 % TD SOLN: TRANSDERMAL | Status: AC

## 2015-04-10 NOTE — Progress Notes (Signed)
[facility] Jonathan Mcintosh, Jonathan Mcintosh [id number] Phone: [phone] Subjective:     CC: Left first toe pain, new right shoulder pain  [id number] Jonathan Mcintosh is a 55 y.o. male coming in with complaint of left toe pain. 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 Patient was given an injection and states that he was doing better for quite some time. Pain does come back in a little more sharp. Even with long-distance walking has some difficulty. Patient denies any pain at rest. Seems to be only when he bears weight. Patient has changes shoes with minimal benefit. No swelling. No numbness.  Patient is also complaining of a new problem. Patient is having more of her right shoulder pain. Describes it as a dull aching sensation that comes on from time to time. Seems to be with only certain motions. Then goes away fairly quickly. Can take days. Sometimes uncomfortable at night. No radiation down the arm. No significant weakness. Patient states even doing no 3 or 4 pushups in a row can cause discomfort.     Past Medical History  Diagnosis Date  . ECZEMA   . PSORIASIS   . GLUCOSE INTOLERANCE 01/2010    a1c 6.0  . CORONARY ATHEROSCLEROSIS NATIVE CORONARY ARTERY 01/2010    DES x 2 RCA  . SINUS BRADYCARDIA   . ANXIETY   . DEPRESSION   . GERD   . HYPERLIPIDEMIA   . 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 Allergy     seasonal   Past Surgical History  Procedure Laterality Date  . 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 Mass excision (l) thigh  08/2009    AVM  . Coronary stent placement      2 stent placed in 2011  . Colonoscopy  11-12-2011  . Polypectomy  11-12-2011    TA x 2    Social History  Substance Use Topics  . Smoking status: Never Smoker   . Smokeless tobacco: Never Used  . Alcohol Use: No   Allergies  Allergen  Reactions  . Amoxicillin Other (See Comments)    Stomach cramps   Family History  Problem Relation Age of Onset  . Heart disease Sister   . Prostate cancer Brother   . Hepatitis Other   . Seizures Brother     Isolated  . Colon cancer Neg Hx   . Esophageal cancer Neg Hx   . Rectal cancer Neg Hx   . Stomach cancer Neg Hx   . Colon polyps Son     juvenile polyps in college- had rectal bleeding     Past medical history, social, surgical and family history all reviewed in electronic medical record.   Review of Systems: No headache, visual changes, nausea, vomiting, diarrhea, constipation, dizziness, abdominal pain, skin rash, fevers, chills, night sweats, weight loss, swollen lymph nodes, body aches, joint swelling, muscle aches, chest pain, shortness of breath, mood changes.   Objective Blood pressure 128/84, pulse 53, weight 192 lb (87.091 kg), SpO2 96 %.  General: No apparent distress alert and oriented x3 mood and affect normal, dressed appropriately.  HEENT: Pupils equal, extraocular movements intact  Respiratory: Patient's speak in full sentences and does not appear short of breath  Cardiovascular: No lower extremity edema, non tender, no erythema  Skin: Warm dry intact with no signs of infection or rash on extremities or on axial skeleton.  Abdomen: Soft nontender  Neuro: Cranial nerves II through XII are intact, neurovascularly intact in all extremities with 2+ DTRs and 2+ pulses.  Lymph: No lymphadenopathy of posterior or anterior cervical chain or axillae bilaterally.  Gait normal with good balance and coordination.  MSK:  Non tender with full range of motion and good stability and symmetric strength and tone of , elbows, wrist, hip, knee and ankles bilaterally.  Foot exam shows the patient does have over pronation of the hindfoot bilaterally. Patient does have hallux rigidus severely on the left foot. Does have mild osteophytic changes of the first MTP joint. Patient also  has what appears to be erosions of the bone just proximal and distal to this joint.  Shoulder: Right  Inspection reveals no abnormalities, atrophy or asymmetry. Palpation is normal with no tenderness over AC joint or bicipital groove. ROM is full in all planes. Rotator cuff strength normal throughout. Mild signs of impingement with negative Neer and Hawkin's tests, empty can sign. Speeds and Yergason's tests normal. Positive labral pathology with O'Brien's Normal scapular function observed. No painful arc and no drop arm sign. No apprehension sign Contralateral shoulder unremarkable.    Procedure note [id number]; 15 minutes spent for Therapeutic exercises as stated in above notes.  This included exercises focusing on stretching, strengthening, with significant focus on eccentric aspects.  Scapular stabilization techniques with external and internal rotation as well as shoulder abduction strengthening exercises. Proper technique shown and discussed handout in great detail with ATC.  All questions were discussed and answered.       Impression and Recommendations:     This case required medical decision making of moderate complexity.

## 2015-04-10 NOTE — Assessment & Plan Note (Signed)
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 Psoriatic arthritis is within the differential. We discussed over-the-counter orthotics, proper shoe choices including a rocker bottom shoe that could be beneficial. We discussed topical anti-inflammatories and prescription sent in today. We discussed icing regimen. Patient and will come back again in 3 weeks. At that time if continuing to have trouble we will consider injection again. Patient may also need x-rays of foot.

## 2015-04-10 NOTE — Patient Instructions (Signed)
Good to see you pennsaid pinkie amount topically 2 times daily as needed. To toe and to the shoulder Exercises 3 times a week.  Ice 20 minutes 2 times daily. Usually after activity and before bed.  For the foot consider allegria or xerelo shoes with walking Spenco orthotics "total support" online would be great  Try the pennsaid  More vitamins Vitamin D 2000 IU daily  .Turmeric 500mg  2 times daily  Tylenol 500mg  3 times daily   See me againin 3 weeks and if not perfect then we will inject something.

## 2015-04-10 NOTE — Assessment & Plan Note (Signed)
New problem. Been going on for greater than 2 years. More of a dull throbbing aching sensation. Differential includes subacromial bursitis, rotator cuff syndrome, or possible labral pathology. We discussed with patient though that home exercises. Most beneficial. Work with Product/process development scientist today. His cast icing regimen. Cast home exercises in greater detail. Patient given prescription for topical anti-inflammatories. Patient will come back in 3 weeks. At that time if continuing have pain I would like to do an ultrasound in with a potential injection.

## 2015-05-01 ENCOUNTER — Ambulatory Visit: Payer: 59 | Admitting: Family Medicine

## 2015-05-15 ENCOUNTER — Ambulatory Visit: Payer: 59 | Admitting: Family Medicine

## 2015-12-08 ENCOUNTER — Other Ambulatory Visit: Payer: Self-pay | Admitting: Internal Medicine

## 2015-12-10 NOTE — Telephone Encounter (Signed)
Called mobile number for patient:   Pt stated that he is seeing a new PCP in [place].  Pt still sees his cardiologist. Pt rq to send the crestor rf to Dr. Burt Knack for review.

## 2015-12-17 ENCOUNTER — Telehealth: Payer: Self-pay | Admitting: Emergency Medicine

## 2015-12-17 NOTE — Telephone Encounter (Signed)
Pt called and he used to be a patient of Dr Asa Lente. Him and his wife were suppose to have new pt appts tomorrow. His wife does have an appt but his appt was never scheduled even though he states his wife made them at the same time. Would you still be willing to except him as a transfer pt?

## 2015-12-17 NOTE — Telephone Encounter (Signed)
yes

## 2015-12-17 NOTE — Telephone Encounter (Signed)
Please advise 

## 2015-12-21 ENCOUNTER — Other Ambulatory Visit: Payer: Self-pay | Admitting: Internal Medicine

## 2016-01-17 ENCOUNTER — Other Ambulatory Visit: Payer: Self-pay | Admitting: Cardiovascular Disease

## 2016-01-31 ENCOUNTER — Ambulatory Visit (INDEPENDENT_AMBULATORY_CARE_PROVIDER_SITE_OTHER): Payer: 59 | Admitting: Cardiovascular Disease

## 2016-01-31 ENCOUNTER — Encounter: Payer: Self-pay | Admitting: Cardiovascular Disease

## 2016-01-31 VITALS — BP 150/76 | HR 60 | Ht 72.0 in | Wt 189.2 lb

## 2016-01-31 DIAGNOSIS — I10 Essential (primary) hypertension: Secondary | ICD-10-CM | POA: Diagnosis not present

## 2016-01-31 DIAGNOSIS — Z23 Encounter for immunization: Secondary | ICD-10-CM | POA: Diagnosis not present

## 2016-01-31 MED ORDER — METOPROLOL TARTRATE 25 MG PO TABS: 25.0000 mg | ORAL_TABLET | Freq: Two times a day (BID) | ORAL | 3 refills | Status: DC

## 2016-01-31 NOTE — Progress Notes (Signed)
Cardiology Office Note Date:  01/31/2016   ID:  Jonathan Mcintosh, DOB [birth year], MRN [id number]  PCP:  No primary care provider on file.  Cardiologist:  Sherren Mocha, MD    Chief Complaint  Patient presents with  . coronary atherosclerosis     History of Present Illness: Jonathan Mcintosh is a 55 y.o. male who presents for Follow-up of coronary artery disease. The patient initially presented in 2011 with an acute inferior wall MI. He was treated with primary PCI using overlapping drug-eluting stents in the right coronary artery. He was noted to have moderate residual coronary disease in the LAD distribution. At the time of his presentation he experienced sharp right parasternal pain.  The patient has had no subsequent ischemic events. He has done quite well and remains physically active. He walks and bicycles for exercise with no exertional symptoms. He specifically denies chest pain, chest pressure, shortness of breath, or heart palpitations. He has noted that systolic blood pressure readings have been in the 140s more frequently than in the past.   Past Medical History:  Diagnosis Date  . 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 Allergy    seasonal  . ANXIETY   . CORONARY ATHEROSCLEROSIS NATIVE CORONARY ARTERY 01/2010   DES x 2 RCA  . DEPRESSION   . ECZEMA   . GERD   . GLUCOSE INTOLERANCE 01/2010   a1c 6.0  . HYPERLIPIDEMIA   . PSORIASIS   . SINUS BRADYCARDIA     Past Surgical History:  Procedure Laterality Date  . AMPUTATION FINGER / THUMB  01.26.2011   Partial- thumb and middle finger of right hand (Dr Lenon Curt)  . COLONOSCOPY  11-12-2011  . CORONARY STENT PLACEMENT     2 stent placed in 2011  . mass excision (L) thigh  08/2009   AVM  . POLYPECTOMY  11-12-2011   TA x 2     Current Outpatient Prescriptions  Medication Sig Dispense Refill  . aspirin 81 MG tablet Take 81 mg by mouth daily.      . ciclopirox (PENLAC) 8 %  solution Apply topically at bedtime. Apply over nail and surrounding skin. Apply daily over previous coat. After seven (7) days, may remove with alcohol and continue cycle. 6.6 mL 0  . Diclofenac Sodium 2 % SOLN Apply 1 pump twice daily. 112 g 3  . metoprolol tartrate (LOPRESSOR) 25 MG tablet TAKE 1/2 TABLET BY MOUTH TWICE DAILY 30 tablet 0  . Multiple Vitamin (MULITIVITAMIN WITH MINERALS) TABS Take 1 tablet by mouth daily.    . nitroGLYCERIN (NITROSTAT) 0.4 MG SL tablet Place 1 tablet (0.4 mg total) under the tongue every 5 (five) minutes as needed. Up to 3 doses 25 tablet 3  . omeprazole (PRILOSEC OTC) 20 MG tablet Take 2 tablets (40 mg total) by mouth daily.    . rosuvastatin (CRESTOR) 40 MG tablet TAKE 1 TABLET(40 MG) BY MOUTH DAILY 90 tablet 0   No current facility-administered medications for this visit.     Allergies:   Amoxicillin   Social History:  The patient  reports that he has never smoked. He has never used smokeless tobacco. He reports that he does not drink alcohol or use drugs.   Family History:  The patient's  family history includes Colon polyps in his son; Heart disease in his sister; Hepatitis in his other; Prostate cancer in his brother; Seizures in his brother.  ROS:  Please see the history of present illness.   All other systems are reviewed and negative.    PHYSICAL EXAM: VS:  BP (!) 150/76   Pulse 60   Ht 6' (1.829 m)   Wt 189 lb 3.2 oz (85.8 kg)   BMI 25.66 kg/m  , BMI Body mass index is 25.66 kg/m. GEN: Well nourished, well developed, in no acute distress  HEENT: normal  Neck: no JVD, no masses. No carotid bruits Cardiac: RRR without murmur or gallop                Respiratory:  clear to auscultation bilaterally, normal work of breathing GI: soft, nontender, nondistended, + BS MS: no deformity or atrophy  Ext: no pretibial edema, pedal pulses 2+= bilaterally Skin: warm and dry, no rash Neuro:  Strength and sensation are intact Psych: euthymic mood,  full affect  EKG:  EKG is ordered today. The ekg ordered today shows normal sinus rhythm 61 bpm, within normal limits.  Recent Labs: No results found for requested labs within last 8760 hours.   Lipid Panel     Component Value Date/Time   CHOL 131 12/05/2014 0910   TRIG 72.0 12/05/2014 0910   HDL 45.20 12/05/2014 0910   CHOLHDL 3 12/05/2014 0910   VLDL 14.4 12/05/2014 0910   LDLCALC 72 12/05/2014 0910   LDLDIRECT 189.8 06/28/2009 0810      Wt Readings from Last 3 Encounters:  01/31/16 189 lb 3.2 oz (85.8 kg)  04/10/15 192 lb (87.1 kg)  02/06/15 191 lb (86.6 kg)    ASSESSMENT AND PLAN: 1.  Coronary artery disease, native vessel, without symptoms of angina: Medications are reviewed and will be continued with aspirin, beta blocker, and a statin drug. Last lipids are reviewed. Patient will return in one year for follow-up evaluation. He is counseled about potential ischemic symptoms and understands to seek immediate attention if anything occurs.  2. Hyperlipidemia: Treated with Crestor 40 mg by mouth annual physical exam his next month and lipids will be drawn at that time.  3. Hypertension, suboptimal control: Recommended increase metoprolol to 25 mg twice daily. He will monitor blood pressure at home to assess response.  Current medicines are reviewed with the patient today.  The patient does not have concerns regarding medicines.  Labs/ tests ordered today include:  No orders of the defined types were placed in this encounter.  Disposition:   FU one year  Signed, Sherren Mocha, MD  01/31/2016 12:32 PM    [address], [place], [place]  [id number] Phone: [phone]; Fax: [phone]

## 2016-01-31 NOTE — Patient Instructions (Signed)
Medication Instructions:  Your physician has recommended you make the following change in your medication:  1. INCREASE Metoprolol Tartrate to 25mg  take one tablet by mouth twice a day  Labwork: No new orders.   Testing/Procedures: No new orders.   Follow-Up: Your physician wants you to follow-up in: 1 YEAR with Dr Burt Knack.  You will receive a reminder letter in the mail two months in advance. If you don't receive a letter, please call our office to schedule the follow-up appointment.   Any Other Special Instructions Will Be Listed Below (If Applicable).  Your physician has requested that you regularly monitor and record your blood pressure readings at home. Please use the same machine at the same time of day to check your readings and record them to bring to your follow-up visit.  If you need a refill on your cardiac medications before your next appointment, please call your pharmacy.

## 2016-02-01 ENCOUNTER — Other Ambulatory Visit: Payer: Self-pay

## 2016-02-01 DIAGNOSIS — R001 Bradycardia, unspecified: Secondary | ICD-10-CM

## 2016-03-04 ENCOUNTER — Other Ambulatory Visit: Payer: Self-pay | Admitting: Cardiovascular Disease

## 2016-03-07 ENCOUNTER — Ambulatory Visit: Payer: 59 | Admitting: Internal Medicine

## 2016-07-01 ENCOUNTER — Telehealth: Payer: Self-pay | Admitting: Cardiovascular Disease

## 2016-07-01 NOTE — Telephone Encounter (Signed)
Melinda with [facility] is returning your call, she asks that you please call her tomorrow after 9 am and have her paged overhead.Thanks.

## 2016-07-01 NOTE — Telephone Encounter (Signed)
Attempted to call [facility] [place] regarding the patient back.Left message to call back.

## 2016-07-01 NOTE — Telephone Encounter (Signed)
New Message:    Please call,pt is going to have surgery,she needs to talk to the nurse.

## 2016-07-02 NOTE — Telephone Encounter (Signed)
I spoke with Jonathan Mcintosh and the pt is scheduled for Rotator cuff repair on 07/10/16.  Jonathan Mcintosh is not requesting cardiac clearance but does need a copy of the pt's most recent OV note, EKG and stress test.  I will fax records to [phone] Attn:Melinda.

## 2016-12-19 ENCOUNTER — Other Ambulatory Visit: Payer: Self-pay | Admitting: Cardiovascular Disease

## 2017-01-24 ENCOUNTER — Other Ambulatory Visit: Payer: Self-pay | Admitting: Cardiovascular Disease

## 2017-01-26 NOTE — Telephone Encounter (Signed)
Medication Detail    Disp Refills Start End   rosuvastatin (CRESTOR) 40 MG tablet 90 tablet 3 03/04/2016    Sig: TAKE 1 TABLET BY MOUTH DAILY   Sent to pharmacy as: rosuvastatin (CRESTOR) 40 MG tablet   E-Prescribing Status: Receipt confirmed by pharmacy (03/04/2016 12:12 PM EST)   Pharmacy   [id number] - [place], [place] - [address] AT [place]

## 2017-07-12 ENCOUNTER — Other Ambulatory Visit: Payer: Self-pay | Admitting: Cardiovascular Disease

## 2017-07-14 ENCOUNTER — Other Ambulatory Visit: Payer: Self-pay | Admitting: Cardiovascular Disease

## 2017-08-15 ENCOUNTER — Other Ambulatory Visit: Payer: Self-pay | Admitting: Cardiovascular Disease

## 2018-02-21 ENCOUNTER — Encounter: Payer: Self-pay | Admitting: Gastroenterology

## 2022-07-15 ENCOUNTER — Emergency Department (HOSPITAL_BASED_OUTPATIENT_CLINIC_OR_DEPARTMENT_OTHER)
Admission: EM | Admit: 2022-07-15 | Discharge: 2022-07-15 | Disposition: A | Discharge status: Home / Self Care | Payer: 59 | Attending: Emergency Medicine | Admitting: Emergency Medicine

## 2022-07-15 ENCOUNTER — Other Ambulatory Visit: Payer: Self-pay

## 2022-07-15 DIAGNOSIS — T7840XA Allergy, unspecified, initial encounter: Secondary | ICD-10-CM | POA: Insufficient documentation

## 2022-07-15 DIAGNOSIS — Z7982 Long term (current) use of aspirin: Secondary | ICD-10-CM | POA: Insufficient documentation

## 2022-07-15 MED ORDER — PREDNISONE 20 MG PO TABS: 40.0000 mg | ORAL_TABLET | Freq: Every day | ORAL | 0 refills | Status: AC

## 2022-07-15 MED ORDER — FAMOTIDINE 20 MG PO TABS
20.0000 mg | ORAL_TABLET | Freq: Once | ORAL | Status: AC
  Administered 2022-07-15: 20 mg via ORAL
  Filled 2022-07-15: qty 1

## 2022-07-15 MED ORDER — DIPHENHYDRAMINE HCL 25 MG PO CAPS
25.0000 mg | ORAL_CAPSULE | Freq: Once | ORAL | Status: AC
  Administered 2022-07-15: 25 mg via ORAL
  Filled 2022-07-15: qty 1

## 2022-07-15 MED ORDER — PREDNISONE 50 MG PO TABS
60.0000 mg | ORAL_TABLET | Freq: Once | ORAL | Status: AC
  Administered 2022-07-15: 60 mg via ORAL
  Filled 2022-07-15: qty 1

## 2022-07-15 NOTE — ED Notes (Signed)
RN reviewed discharge instructions with pt. Pt verbalized understanding and had no further questions. VSS upon discharge.  

## 2022-07-15 NOTE — ED Provider Notes (Signed)
[place] EMERGENCY DEPARTMENT AT [facility] Provider Note   CSN: [id number] Arrival date & time: 07/15/22  2009     History  Chief Complaint  Patient presents with   Allergic Reaction    Jonathan Mcintosh is a 62 y.o. male presenting today with the concern for an allergic reaction.  He reports that in the late afternoon he started to have some watering eyes, postnasal drip and swelling to the right side of his throat.  No known allergens.    Allergic Reaction      Home Medications Prior to Admission medications   Medication Sig Start Date End Date Taking? Authorizing Provider  predniSONE (DELTASONE) 20 MG tablet Take 2 tablets (40 mg total) by mouth daily for 4 days. 07/15/22 07/19/22 Yes Audrielle Vankuren A, PA-C  aspirin 81 MG tablet Take 81 mg by mouth daily.      [provider]  ciclopirox (PENLAC) 8 % solution Apply topically at bedtime. Apply over nail and surrounding skin. Apply daily over previous coat. After seven (7) days, may remove with alcohol and continue cycle. 12/05/14   Newt Lukes, MD  Diclofenac Sodium 2 % SOLN Apply 1 pump twice daily. 04/10/15   Judi Saa, DO  metoprolol tartrate (LOPRESSOR) 25 MG tablet TAKE 1 TABLET BY MOUTH TWICE DAILY 08/17/17   Tonny Bollman, MD  Multiple Vitamin (MULITIVITAMIN WITH MINERALS) TABS Take 1 tablet by mouth daily.    [provider]  nitroGLYCERIN (NITROSTAT) 0.4 MG SL tablet Place 1 tablet (0.4 mg total) under the tongue every 5 (five) minutes as needed. Up to 3 doses 10/24/14   Rosalio Macadamia, NP  omeprazole (PRILOSEC OTC) 20 MG tablet Take 2 tablets (40 mg total) by mouth daily. 10/31/11   Newt Lukes, MD  rosuvastatin (CRESTOR) 40 MG tablet TAKE 1 TABLET BY MOUTH DAILY 03/04/16   Tonny Bollman, MD      Allergies    Amoxicillin    Review of Systems   Review of Systems  Physical Exam Updated Vital Signs BP (!) 159/96   Pulse 69   Temp 97.7 F (36.5 C)   Resp 18    Ht 6' (1.829 m)   Wt 83.9 kg   SpO2 96%   BMI 25.09 kg/m  Physical Exam Vitals and nursing note reviewed.  Constitutional:      Appearance: Normal appearance.  HENT:     Head: Normocephalic and atraumatic.     Mouth/Throat:     Mouth: No angioedema.      Comments: Some mild swelling to the right sided posterior oropharynx.  No signs of abscess.  Airway is still clear and patient is tolerating secretions Eyes:     General: No scleral icterus.    Conjunctiva/sclera: Conjunctivae normal.  Cardiovascular:     Rate and Rhythm: Normal rate and regular rhythm.  Pulmonary:     Effort: Pulmonary effort is normal. No respiratory distress.     Breath sounds: No wheezing or rales.  Skin:    Findings: No rash.     Comments: No urticaria  Neurological:     Mental Status: He is alert.  Psychiatric:        Mood and Affect: Mood normal.     ED Results / Procedures / Treatments   Labs (all labs ordered are listed, but only abnormal results are displayed) Labs Reviewed - No data to display  EKG None  Radiology No results found.  Procedures Procedures  Medications Ordered in ED Medications  diphenhydrAMINE (BENADRYL) capsule 25 mg (25 mg Oral Given 07/15/22 2235)  predniSONE (DELTASONE) tablet 60 mg (60 mg Oral Given 07/15/22 2235)  famotidine (PEPCID) tablet 20 mg (20 mg Oral Given 07/15/22 2235)    ED Course/ Medical Decision Making/ A&P                             Medical Decision Making Risk Prescription drug management.   Treatment: Pepcid, Benadryl and prednisone  Dispo: 62 year old male who presented today with the concern for an allergic reaction.  Airway is clear, tolerating secretions.   Denied any vomiting or diarrhea.  There was some small amount of swelling on the right posterior oropharynx however no signs of abscess and no angioedema.  No urticaria.  Do not believe patient is having anaphylactic reaction.  I am unsure what he may have been exposed to to  cause his symptoms however he was treated with Pepcid, Benadryl and prednisone.  Will give him 4 more days of prednisone as well.  Return precautions were given.  Patient was discharged  Final Clinical Impression(s) / ED Diagnoses Final diagnoses:  Allergic reaction, initial encounter    Rx / DC Orders ED Discharge Orders          Ordered    predniSONE (DELTASONE) 20 MG tablet  Daily        07/15/22 2250           Results and diagnoses were explained to the patient. Return precautions discussed in full. Patient had no additional questions and expressed complete understanding.   This chart was dictated using voice recognition software.  Despite best efforts to proofread,  errors can occur which can change the documentation meaning.    Woodroe Chen 07/15/22 2307    Terald Sleeper, MD 07/16/22 [phone]

## 2022-07-15 NOTE — ED Triage Notes (Signed)
Pt states that he thinks he had a reaction to something. States that he sinus drainage, watery eyes, and the right side of his throat is swollen. Denies difficulty swallowing. States he took metamucil that he had in an old metoprolol bottle 5 minutes prior.

## 2022-07-15 NOTE — Discharge Instructions (Addendum)
You came to the emergency department today with a concern for an allergic reaction.  We are unable to pinpoint what you are having an allergic reaction to however you were treated with Benadryl, famotidine and prednisone.  Prednisone is the steroid that I am continuing you on for the next 4 days.  You may start this tomorrow.  For any itching or overall discomfort you may use Benadryl over-the-counter as well.  Please return to the nearest emergency department with any worsening symptoms.  Especially shortness of breath, wheezing, swelling to the lips or posterior oropharynx, vomiting or diarrhea.  It was a pleasure to meet you and I hope you feel better.
